# Patient Record
Sex: Female | Born: 1955 | Race: White | Hispanic: No | Smoking: Current every day smoker
Health system: Southern US, Community
[De-identification: ages and names within clinical notes are randomized; demographics above are authoritative.]

## PROBLEM LIST (undated history)

## (undated) DIAGNOSIS — K589 Irritable bowel syndrome without diarrhea: Secondary | ICD-10-CM

## (undated) DIAGNOSIS — R87619 Unspecified abnormal cytological findings in specimens from cervix uteri: Secondary | ICD-10-CM

## (undated) DIAGNOSIS — R1013 Epigastric pain: Secondary | ICD-10-CM

## (undated) DIAGNOSIS — R112 Nausea with vomiting, unspecified: Secondary | ICD-10-CM

## (undated) DIAGNOSIS — M1990 Unspecified osteoarthritis, unspecified site: Secondary | ICD-10-CM

## (undated) DIAGNOSIS — J302 Other seasonal allergic rhinitis: Secondary | ICD-10-CM

## (undated) DIAGNOSIS — M75102 Unspecified rotator cuff tear or rupture of left shoulder, not specified as traumatic: Secondary | ICD-10-CM

## (undated) DIAGNOSIS — Z8619 Personal history of other infectious and parasitic diseases: Secondary | ICD-10-CM

## (undated) DIAGNOSIS — B009 Herpesviral infection, unspecified: Secondary | ICD-10-CM

## (undated) DIAGNOSIS — Z9889 Other specified postprocedural states: Secondary | ICD-10-CM

## (undated) DIAGNOSIS — C439 Malignant melanoma of skin, unspecified: Secondary | ICD-10-CM

## (undated) DIAGNOSIS — J45909 Unspecified asthma, uncomplicated: Secondary | ICD-10-CM

## (undated) DIAGNOSIS — M25519 Pain in unspecified shoulder: Secondary | ICD-10-CM

## (undated) HISTORY — DX: Unspecified abnormal cytological findings in specimens from cervix uteri: R87.619

## (undated) HISTORY — PX: OTHER SURGICAL HISTORY: SHX169

## (undated) HISTORY — DX: Other seasonal allergic rhinitis: J30.2

## (undated) HISTORY — DX: Herpesviral infection, unspecified: B00.9

## (undated) HISTORY — PX: SKIN GRAFT: SHX250

## (undated) HISTORY — DX: Malignant melanoma of skin, unspecified: C43.9

## (undated) HISTORY — PX: MOLE REMOVAL: SHX2046

## (undated) HISTORY — DX: Personal history of other infectious and parasitic diseases: Z86.19

## (undated) HISTORY — DX: Irritable bowel syndrome, unspecified: K58.9

## (undated) HISTORY — DX: Unspecified osteoarthritis, unspecified site: M19.90

## (undated) HISTORY — PX: TUBAL LIGATION: SHX77

## (undated) HISTORY — DX: Unspecified rotator cuff tear or rupture of left shoulder, not specified as traumatic: M75.102

## (undated) HISTORY — DX: Epigastric pain: R10.13

## (undated) HISTORY — DX: Pain in unspecified shoulder: M25.519

## (undated) HISTORY — DX: Unspecified asthma, uncomplicated: J45.909

---

## 1995-05-25 HISTORY — PX: DILATION AND CURETTAGE OF UTERUS: SHX78

## 1997-09-05 ENCOUNTER — Other Ambulatory Visit: Admission: RE | Admit: 1997-09-05 | Discharge: 1997-09-05 | Payer: Self-pay | Admitting: Obstetrics and Gynecology

## 1998-04-04 ENCOUNTER — Ambulatory Visit (HOSPITAL_COMMUNITY): Admission: RE | Admit: 1998-04-04 | Discharge: 1998-04-04 | Payer: Self-pay | Admitting: Obstetrics and Gynecology

## 1998-04-04 ENCOUNTER — Encounter: Payer: Self-pay | Admitting: Obstetrics and Gynecology

## 1998-04-22 ENCOUNTER — Ambulatory Visit (HOSPITAL_COMMUNITY): Admission: RE | Admit: 1998-04-22 | Discharge: 1998-04-22 | Payer: Self-pay | Admitting: Obstetrics and Gynecology

## 1998-04-23 ENCOUNTER — Other Ambulatory Visit: Admission: RE | Admit: 1998-04-23 | Discharge: 1998-04-23 | Payer: Self-pay | Admitting: Obstetrics and Gynecology

## 1998-05-12 ENCOUNTER — Encounter: Payer: Self-pay | Admitting: Obstetrics and Gynecology

## 1998-05-12 ENCOUNTER — Ambulatory Visit (HOSPITAL_COMMUNITY): Admission: RE | Admit: 1998-05-12 | Discharge: 1998-05-12 | Payer: Self-pay | Admitting: Obstetrics and Gynecology

## 1998-05-21 ENCOUNTER — Ambulatory Visit (HOSPITAL_COMMUNITY): Admission: RE | Admit: 1998-05-21 | Discharge: 1998-05-21 | Payer: Self-pay | Admitting: Obstetrics and Gynecology

## 1998-05-29 ENCOUNTER — Other Ambulatory Visit: Admission: RE | Admit: 1998-05-29 | Discharge: 1998-05-29 | Payer: Self-pay | Admitting: Obstetrics and Gynecology

## 2000-09-06 ENCOUNTER — Other Ambulatory Visit: Admission: RE | Admit: 2000-09-06 | Discharge: 2000-09-06 | Payer: Self-pay | Admitting: Obstetrics and Gynecology

## 2000-09-07 ENCOUNTER — Ambulatory Visit (HOSPITAL_COMMUNITY): Admission: RE | Admit: 2000-09-07 | Discharge: 2000-09-07 | Payer: Self-pay | Admitting: *Deleted

## 2000-09-07 ENCOUNTER — Encounter: Payer: Self-pay | Admitting: *Deleted

## 2002-03-06 ENCOUNTER — Other Ambulatory Visit: Admission: RE | Admit: 2002-03-06 | Discharge: 2002-03-06 | Payer: Self-pay | Admitting: Obstetrics and Gynecology

## 2003-04-16 ENCOUNTER — Other Ambulatory Visit: Admission: RE | Admit: 2003-04-16 | Discharge: 2003-04-16 | Payer: Self-pay | Admitting: Obstetrics and Gynecology

## 2003-04-24 ENCOUNTER — Ambulatory Visit (HOSPITAL_COMMUNITY): Admission: RE | Admit: 2003-04-24 | Discharge: 2003-04-24 | Payer: Self-pay | Admitting: Obstetrics and Gynecology

## 2004-06-12 ENCOUNTER — Other Ambulatory Visit: Admission: RE | Admit: 2004-06-12 | Discharge: 2004-06-12 | Payer: Self-pay | Admitting: Obstetrics and Gynecology

## 2004-08-31 ENCOUNTER — Ambulatory Visit (HOSPITAL_COMMUNITY): Admission: RE | Admit: 2004-08-31 | Discharge: 2004-08-31 | Payer: Self-pay | Admitting: Obstetrics and Gynecology

## 2004-08-31 ENCOUNTER — Ambulatory Visit (HOSPITAL_BASED_OUTPATIENT_CLINIC_OR_DEPARTMENT_OTHER): Admission: RE | Admit: 2004-08-31 | Discharge: 2004-08-31 | Payer: Self-pay | Admitting: Obstetrics and Gynecology

## 2005-07-26 ENCOUNTER — Ambulatory Visit (HOSPITAL_COMMUNITY): Admission: RE | Admit: 2005-07-26 | Discharge: 2005-07-26 | Payer: Self-pay | Admitting: Obstetrics and Gynecology

## 2005-07-29 ENCOUNTER — Other Ambulatory Visit: Admission: RE | Admit: 2005-07-29 | Discharge: 2005-07-29 | Payer: Self-pay | Admitting: Obstetrics & Gynecology

## 2006-09-19 ENCOUNTER — Ambulatory Visit (HOSPITAL_COMMUNITY): Admission: RE | Admit: 2006-09-19 | Discharge: 2006-09-19 | Payer: Self-pay | Admitting: Obstetrics and Gynecology

## 2006-09-29 ENCOUNTER — Other Ambulatory Visit: Admission: RE | Admit: 2006-09-29 | Discharge: 2006-09-29 | Payer: Self-pay | Admitting: Obstetrics & Gynecology

## 2007-06-01 ENCOUNTER — Encounter: Payer: Self-pay | Admitting: Orthopedic Surgery

## 2007-06-01 ENCOUNTER — Ambulatory Visit (HOSPITAL_COMMUNITY): Admission: RE | Admit: 2007-06-01 | Discharge: 2007-06-01 | Payer: Self-pay | Admitting: Family Medicine

## 2007-06-07 ENCOUNTER — Ambulatory Visit: Payer: Self-pay | Admitting: Orthopedic Surgery

## 2007-06-07 DIAGNOSIS — M25519 Pain in unspecified shoulder: Secondary | ICD-10-CM | POA: Insufficient documentation

## 2007-06-07 DIAGNOSIS — M67919 Unspecified disorder of synovium and tendon, unspecified shoulder: Secondary | ICD-10-CM | POA: Insufficient documentation

## 2007-06-07 DIAGNOSIS — M719 Bursopathy, unspecified: Secondary | ICD-10-CM

## 2007-08-24 ENCOUNTER — Ambulatory Visit (HOSPITAL_COMMUNITY): Admission: RE | Admit: 2007-08-24 | Discharge: 2007-08-24 | Payer: Self-pay | Admitting: Family Medicine

## 2007-09-28 ENCOUNTER — Ambulatory Visit (HOSPITAL_COMMUNITY): Admission: RE | Admit: 2007-09-28 | Discharge: 2007-09-28 | Payer: Self-pay | Admitting: Obstetrics and Gynecology

## 2007-10-03 ENCOUNTER — Other Ambulatory Visit: Admission: RE | Admit: 2007-10-03 | Discharge: 2007-10-03 | Payer: Self-pay | Admitting: Obstetrics & Gynecology

## 2007-10-10 LAB — HM COLONOSCOPY: HM Colonoscopy: NEGATIVE

## 2007-10-31 ENCOUNTER — Ambulatory Visit (HOSPITAL_COMMUNITY): Admission: RE | Admit: 2007-10-31 | Discharge: 2007-10-31 | Payer: Self-pay | Admitting: General Surgery

## 2008-09-30 ENCOUNTER — Ambulatory Visit (HOSPITAL_COMMUNITY): Admission: RE | Admit: 2008-09-30 | Discharge: 2008-09-30 | Payer: Self-pay | Admitting: Obstetrics and Gynecology

## 2008-10-09 ENCOUNTER — Ambulatory Visit (HOSPITAL_COMMUNITY): Admission: RE | Admit: 2008-10-09 | Discharge: 2008-10-09 | Payer: Self-pay | Admitting: Obstetrics and Gynecology

## 2008-10-14 ENCOUNTER — Emergency Department (HOSPITAL_COMMUNITY): Admission: EM | Admit: 2008-10-14 | Discharge: 2008-10-14 | Payer: Self-pay | Admitting: Emergency Medicine

## 2009-02-19 ENCOUNTER — Ambulatory Visit (HOSPITAL_COMMUNITY): Admission: RE | Admit: 2009-02-19 | Discharge: 2009-02-19 | Payer: Self-pay | Admitting: Family Medicine

## 2009-07-02 ENCOUNTER — Ambulatory Visit (HOSPITAL_COMMUNITY): Admission: RE | Admit: 2009-07-02 | Discharge: 2009-07-02 | Payer: Self-pay | Admitting: Family Medicine

## 2009-07-10 ENCOUNTER — Ambulatory Visit: Payer: Self-pay | Admitting: Gastroenterology

## 2009-07-10 DIAGNOSIS — R1013 Epigastric pain: Secondary | ICD-10-CM | POA: Insufficient documentation

## 2009-07-10 DIAGNOSIS — C439 Malignant melanoma of skin, unspecified: Secondary | ICD-10-CM | POA: Insufficient documentation

## 2009-07-10 DIAGNOSIS — Z8719 Personal history of other diseases of the digestive system: Secondary | ICD-10-CM | POA: Insufficient documentation

## 2009-07-10 DIAGNOSIS — K5900 Constipation, unspecified: Secondary | ICD-10-CM | POA: Insufficient documentation

## 2009-07-10 DIAGNOSIS — Z8619 Personal history of other infectious and parasitic diseases: Secondary | ICD-10-CM | POA: Insufficient documentation

## 2009-07-16 ENCOUNTER — Encounter (HOSPITAL_COMMUNITY): Admission: RE | Admit: 2009-07-16 | Discharge: 2009-08-15 | Payer: Self-pay | Admitting: Gastroenterology

## 2009-07-18 ENCOUNTER — Encounter: Payer: Self-pay | Admitting: Gastroenterology

## 2009-07-28 ENCOUNTER — Ambulatory Visit: Payer: Self-pay | Admitting: Gastroenterology

## 2009-07-28 ENCOUNTER — Ambulatory Visit (HOSPITAL_COMMUNITY): Admission: RE | Admit: 2009-07-28 | Discharge: 2009-07-28 | Payer: Self-pay | Admitting: Gastroenterology

## 2009-10-29 ENCOUNTER — Ambulatory Visit: Payer: Self-pay | Admitting: Gastroenterology

## 2010-04-29 ENCOUNTER — Ambulatory Visit: Payer: Self-pay | Admitting: Gastroenterology

## 2010-06-14 ENCOUNTER — Encounter: Payer: Self-pay | Admitting: Family Medicine

## 2010-06-25 NOTE — Assessment & Plan Note (Signed)
Summary: EPIGASTRIC PAIN, IBS-C   Visit Type:  Follow-up Visit Primary Care Provider:  Dr. Regino Schultze  Chief Complaint:  F/U abd pain.  History of Present Illness: Read literature and concerned about OMP and it's side effects. Drinks soft drink and gets signs. Did go w/o it for 2 days and no signs. Probiotics are helping. BM: daily.   Current Medications (verified): 1)  Allegra 180 Mg Tabs (Fexofenadine Hcl) .... As Needed 2)  Rynatan 9-25 Mg Tabs (Chlorphen Tan-Phenyleph Tan) .... As Needed 3)  Omeprazole 20 Mg Cpdr (Omeprazole) .... Take 1 Tablet By Mouth Once A Day  Allergies (verified): No Known Drug Allergies  Past History:  Past Medical History: Last updated: 07/10/2009 seasonal allergies Hx of SALMONELLA INFECTION (ICD-003.9) MELANOMA OF SKIN, SITE UNSPECIFIED (ICD-172.9) right thigh 2009 IRRITABLE BOWEL SYNDROME, HX OF (ICD-V12.79) CONSTIPATION (ICD-564.00) ABDOMINAL PAIN, EPIGASTRIC (ICD-789.06) SHOULDER PAIN (ICD-719.41) ROTATOR CUFF SYNDROME, LEFT (ICD-726.10)  Past Surgical History: Last updated: 07/10/2009 tubal ligation thermal ablation of uterus  Vital Signs:  Patient profile:   55 year old female Height:      65.5 inches Weight:      140.50 pounds BMI:     23.11 Temp:     98.1 degrees F oral Pulse rate:   64 / minute BP sitting:   120 / 78  (left arm)  Physical Exam  General:  Well developed, well nourished, no acute distress. Head:  Normocephalic and atraumatic. Eyes:  PERRLA, no icterus. Mouth:  No deformity or lesions, dentition normal. Neck:  Supple; no masses. Lungs:  Clear throughout to auscultation. Heart:  Regular rate and rhythm; no murmurs. Abdomen:  Soft, nontender and nondistended. Normal bowel sounds.  Extremities:  No edema or deformities noted. Neurologic:  Alert and  oriented x4;  grossly normal neurologically.  Impression & Recommendations:  Problem # 1:  ABDOMINAL PAIN, EPIGASTRIC (ICD-789.06) Assessment Improved  Likely  2o to IBS-c and gastritis. Sx improved after OMP and probiotics. May use OMP as needed. Continue probiotics. OPV in 6-12 mos. Call if Sx return and will see sooner.   CC: PCP  Orders: Est. Patient Level II ([address])  Appended Document: EPIGASTRIC PAIN, IBS-C reminder in computer  Appended Document: FU OV IN 6 MONTHS,EPIGASTRIC PAIN/SS    Prescriptions: OMEPRAZOLE 20 MG CPDR (OMEPRAZOLE) Take 1 tablet by mouth once a day  as needed  #30 x 11   Entered and Authorized by:   Gerrit Halls NP   Signed by:   Gerrit Halls NP on 04/29/2010   Method used:   Faxed to ...       [facility] (retail)       [address]       [place], [place]  [address]       Ph: [phone]       Fax: [phone]   RxID:   [id number]

## 2010-06-25 NOTE — Assessment & Plan Note (Signed)
Summary: FU OV IN 6 MONTHS,EPIGASTRIC PAIN/SS   Visit Type:  Follow-up Visit Primary Care Provider:  Dr. Regino Schultze  CC:  F/U epigastric pain.  History of Present Illness: Ms. [name] returns for a 6 month follow-up for gastritis, IBS-C. Reports occasionally indigestion, but takes prilosec once or twice/week, or if going out to eat. symptoms well-controlled, no dysphagia. drinking 64 oz of water daily, averages BM every 2 days, takes generic laxative, then has BM. does laxative twice/month. hard stools. doesn't want to take colace, getting ready to take probiotics again.   Current Medications (verified): 1)  Allegra 180 Mg Tabs (Fexofenadine Hcl) .... As Needed 2)  Rynatan 9-25 Mg Tabs (Chlorphen Tan-Phenyleph Tan) .... As Needed 3)  Omeprazole 20 Mg Cpdr (Omeprazole) .... Take 1 Tablet By Mouth Once A Day  As Needed 4)  Vitamin B-12 2500 Mcg Subl (Cyanocobalamin) .Marland Kitchen.. 1 Sl Daily  Allergies (verified): No Known Drug Allergies  Past History:  Past Medical History: Last updated: 07/10/2009 seasonal allergies Hx of SALMONELLA INFECTION (ICD-003.9) MELANOMA OF SKIN, SITE UNSPECIFIED (ICD-172.9) right thigh 2009 IRRITABLE BOWEL SYNDROME, HX OF (ICD-V12.79) CONSTIPATION (ICD-564.00) ABDOMINAL PAIN, EPIGASTRIC (ICD-789.06) SHOULDER PAIN (ICD-719.41) ROTATOR CUFF SYNDROME, LEFT (ICD-726.10)  Review of Systems General:  Denies fever, chills, and anorexia. Eyes:  Denies blurring, irritation, and discharge. ENT:  Denies sore throat, hoarseness, and difficulty swallowing. CV:  Denies chest pains and syncope. Resp:  Denies dyspnea at rest and wheezing. GI:  Complains of constipation; denies difficulty swallowing, pain on swallowing, nausea, abdominal pain, change in bowel habits, bloody BM's, and black BMs; rare indigestion, relieved by as needed prilosec. GU:  Denies urinary burning and urinary frequency. MS:  Denies joint pain / LOM and joint swelling. Derm:  Denies rash, itching, and dry  skin. Neuro:  Denies weakness and syncope. Psych:  Denies depression and anxiety. Endo:  Denies cold intolerance and heat intolerance.  Vital Signs:  Patient profile:   55 year old female Height:      65.5 inches Weight:      145 pounds BMI:     23.85 Temp:     98.2 degrees F oral Pulse rate:   72 / minute BP sitting:   120 / 70  (left arm) Cuff size:   regular  Vitals Entered By: Cloria Spring LPN (April 29, 2010 2:20 PM)  Physical Exam  General:  Well developed, well nourished, no acute distress. Lungs:  Clear throughout to auscultation. Heart:  Regular rate and rhythm; no murmurs, rubs,  or bruits. Abdomen:  normal bowel sounds, without guarding, without rebound, no distesion, no tenderness, no masses, and no hepatomegally or splenomegaly.   Msk:  Symmetrical with no gross deformities. Normal posture. Neurologic:  Alert and  oriented x4;  grossly normal neurologically.  Impression & Recommendations:  Problem # 1:  ABDOMINAL PAIN, EPIGASTRIC (ICD-31.41)  55 year old pleasant female with resolved epigastric pain. rare reflux, takes prilosec only as needed. Doing well.   Continue Prilosec as needed F/U in 1 year or sooner if indicated  Orders: Est. Patient Level II ([address])  Problem # 2:  CONSTIPATION (ICD-564.00)  Hx of IBS-C. BM every two days, takes OTC laxative as needed. Does not want to take stool softeners.   Probiotic Supplemental fiber of choice F/U in 1 year  Orders: Est. Patient Level II ([address])  Appended Document: FU OV IN 6 MONTHS,EPIGASTRIC PAIN/SS    Prescriptions: OMEPRAZOLE 20 MG CPDR (OMEPRAZOLE) Take 1 tablet by mouth once a day  as  needed  #30 x 11   Entered and Authorized by:   Gerrit Halls NP   Signed by:   Gerrit Halls NP on 04/29/2010   Method used:   Faxed to ...       [facility] (retail)       [address]       [place], [place]  [address]       Ph: [phone]       Fax: [phone]   RxID:    [id number]

## 2010-06-25 NOTE — Letter (Signed)
Summary: EGD/TCS ORDER  EGD/TCS ORDER   Imported By: [facility] 07/18/2009 12:31:06  _____________________________________________________________________  External Attachment:    Type:   Image     Comment:   External Document

## 2010-06-25 NOTE — Letter (Signed)
Summary: HIDA SCAN ORDER  HIDA SCAN ORDER   Imported By: [facility] 07/10/2009 11:35:42  _____________________________________________________________________  External Attachment:    Type:   Image     Comment:   External Document

## 2010-06-25 NOTE — Assessment & Plan Note (Signed)
Summary: ABD PAIN,EPIGASTRIC PAIN,VOMITING,NAUSEA.GLU   Visit Type:  Initial Consult Referring Provider:  C[facility] Primary Care Provider:  C[facility]  Chief Complaint:  abd pain/ epigastric pain.  History of Present Illness: 55 y/o caucasian female w/ abd pain  & N/V.  1 month ago began to have radiating intermittant epigastric pain w/ vomiting.  2 discrete episodes that lasted almost all day long.  Both episodes right after lunch.  c/o daily indigestion and belching, does not take anything.  Pepsi helps.  Denies dysphagia, odynphagia, anorexia, or early satiety.  Hx salmonella last fall.  Weight gain 14# in 4 months since then.  c/o constipation up to 3 days between BM, taking OTC correctol once weekly.  Denies enema use.  Labs done at Cresenzo's office recently, but I do not have yet.  Abd US-> 07/02/09 normal except small cyst left kidney.  HX IBS years ago, but has not had problems recently. Taking IBU once/mo.    Current Problems (verified): 1)  Hx of Salmonella Infection  (ICD-003.9) 2)  Melanoma of Skin, Site Unspecified  (ICD-172.9) 3)  Irritable Bowel Syndrome, Hx of  (ICD-V12.79) 4)  Constipation  (ICD-564.00) 5)  Abdominal Pain, Epigastric  (ICD-789.06) 6)  Shoulder Pain  (ICD-719.41) 7)  Rotator Cuff Syndrome, Left  (ICD-726.10) 8)  Family History of Arthritis  (ICD-V17.7) 9)  Family History of Diabetes  (ICD-V18.0)  Current Medications (verified): 1)  Allegra 180 Mg Tabs (Fexofenadine Hcl) .... As Needed 2)  Rynatan 9-25 Mg Tabs (Chlorphen Tan-Phenyleph Tan) .... As Needed  Allergies (verified): No Known Drug Allergies  Past History:  Past Medical History: seasonal allergies Hx of SALMONELLA INFECTION (ICD-003.9) MELANOMA OF SKIN, SITE UNSPECIFIED (ICD-172.9) right thigh 2009 IRRITABLE BOWEL SYNDROME, HX OF (ICD-V12.79) CONSTIPATION (ICD-564.00) ABDOMINAL PAIN, EPIGASTRIC (ICD-789.06) SHOULDER PAIN (ICD-719.41) ROTATOR CUFF SYNDROME, LEFT (ICD-726.10)  Past  Surgical History: tubal ligation thermal ablation of uterus  Family History: No known family history of colorectal carcinoma, IBD, liver or chronic GI problems. 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 Quit 2007, 28 pkyr hx Alcohol Use - yes, 3 beers/2-3 glasses of wine 4 nights/wk Illicit Drug Use - no Patient does not get regular exercise.  Smoking Status:  quit Does Patient Exercise:  no Drug Use:  no  Review of Systems General:  Complains of fatigue, malaise, and sleep disorder; denies fever, sweats, anorexia, and weakness. CV:  Denies chest pains, angina, palpitations, syncope, dyspnea on exertion, orthopnea, PND, peripheral edema, and claudication. Resp:  Denies dyspnea at rest, dyspnea with exercise, cough, sputum, wheezing, coughing up blood, and pleurisy. GI:  Denies jaundice, diarrhea, bloody BM's, black BMs, and fecal incontinence. Derm:  Complains of moles; denies rash, itching, dry skin, hives, warts, and unhealing ulcers; Dr. Charlton Haws. Psych:  Denies depression, anxiety, memory loss, suicidal ideation, hallucinations, paranoia, phobia, and confusion. Heme:  Denies bruising, bleeding, and enlarged lymph nodes.  Vital Signs:  Patient profile:   55 year old female Height:      65.5 inches Weight:      144 pounds BMI:     23.68 Temp:     98.8 degrees F oral Pulse rate:   64 / minute BP sitting:   118 / 78  (left arm) Cuff size:   regular  Vitals Entered By: Cloria Spring LPN (July 10, 2009 10:15 AM)  Physical Exam  General:  Well developed, well nourished, no acute distress. Head:  Normocephalic and atraumatic. Eyes:  Sclera clear, no icterus. Ears:  Normal auditory acuity. Nose:  No deformity, discharge,  or lesions. Mouth:  No deformity or lesions, dentition normal. Neck:  Supple; no masses  or thyromegaly. Lungs:  Clear throughout to auscultation. Heart:  Regular rate and rhythm; no murmurs, rubs,  or bruits. Abdomen:  Soft, nontender and nondistended. No masses, hepatosplenomegaly or hernias noted. Normal bowel sounds.without guarding and without rebound.  Negative Murphy's sign. Msk:  Symmetrical with no gross deformities. Normal posture. Pulses:  Normal pulses noted. Extremities:  No clubbing, cyanosis, edema or deformities noted. Neurologic:  Alert and  oriented x4;  grossly normal neurologically. Skin:  Intact without significant lesions or rashes. Cervical Nodes:  No significant cervical adenopathy. Psych:  Alert and cooperative. Normal mood and affect.  Impression & Recommendations:  Problem # 1:  ABDOMINAL PAIN, EPIGASTRIC (ICD-789.06) 55 y/o caucasian female w/ intermittant post-prandial biliary type pain, will check for biliary dyskinesia.  Other differentials include GERD, gastritis, PUD, IBS-C.  Orders: Consultation Level III ([address])  Problem # 2:  CONSTIPATION (ICD-564.00) See #1  Patient Instructions: 1)  I will call w/ HIDA scan results. 2)  If normal, we will set up EGD and screening colonoscopy w/ Dr Darrick Penna 3)  Constipation brochure given.  4)  Begin Miralax 17 grams daily as needed for constipation (samples given)

## 2010-10-06 NOTE — H&P (Signed)
[name], [name]                  ACCOUNT NO.:  [id number]   MEDICAL RECORD NO.:  [id number]           PATIENT TYPE:  AMB   LOCATION:  DAY                           FACILITY:  [facility]   PHYSICIAN:  Dalia Heading, M.D.  DATE OF BIRTH:  February 22, 1956   DATE OF ADMISSION:  DATE OF DISCHARGE:  LH                              HISTORY & PHYSICAL   CHIEF COMPLAINT:  Need for screening colonoscopy.   HISTORY OF PRESENT ILLNESS:  The patient is a 55 year old white female  who was referred for endoscopic evaluation.  She needs a colonoscopy for  screening purposes.  No abdominal pain, weight loss, nausea, vomiting,  diarrhea, constipation, melena, or hematochezia have been noted.  She  has never had a colonoscopy.  There is no family history of colon  carcinoma.   PAST MEDICAL HISTORY:  Includes extrinsic allergies.   PAST SURGICAL HISTORY:  Tubal ligation.   CURRENT MEDICATIONS:  Clarinex.   ALLERGIES:  No known drug allergies.   REVIEW OF SYSTEMS:  The patient drinks 12 packs of beer a week.  She  denies any alcohol use.  She denies any other cardiopulmonary  difficulties or bleeding disorders.   PHYSICAL EXAMINATION:  GENERAL:  The patient is a well-developed and  well-nourished white female in no acute distress.  LUNGS:  Clear to auscultation with equal breath sounds bilaterally.  HEART:  Reveals regular rate and rhythm without S3, S4, or murmurs.  ABDOMEN:  Soft, nontender, and nondistended.  No hepatosplenomegaly or  masses are noted.  RECTAL:  Deferred to the procedure.   IMPRESSION:  Need for screening colonoscopy.   PLAN:  The patient is scheduled for a colonoscopy on Oct 10, 2007.  The  risks and benefits of the procedure including bleeding and perforation  were fully explained to the patient, gave informed consent.      Dalia Heading, M.D.  Electronically Signed     MAJ/MEDQ  D:  09/28/2007  T:  09/29/2007  Job:  [id number]   cc:   Kirk Ruths, M.D.  Fax:  [phone]

## 2010-10-09 NOTE — Op Note (Signed)
[name], [name]                  ACCOUNT NO.:  [id number]   MEDICAL RECORD NO.:  [id number]          PATIENT TYPE:  AMB   LOCATION:  [facility]                         FACILITY:  [facility]   PHYSICIAN:  Cynthia P. Romine, M.D.DATE OF BIRTH:  [birth year]   DATE OF PROCEDURE:  08/31/2004  DATE OF DISCHARGE:                                 OPERATIVE REPORT   PREOPERATIVE DIAGNOSIS:  Menorrhagia.   POSTOPERATIVE DIAGNOSIS:  Menorrhagia.   PROCEDURE:  Endometrial ablation using hydrotherm ablation.   SURGEON:  Cynthia P. Romine, M.D.   ANESTHESIA:  General by LMA.   ESTIMATED BLOOD LOSS:  Minimal.   COMPLICATIONS:  None.   DESCRIPTION OF PROCEDURE:  The patient was taken to the operating room, and  after the induction of adequate general anesthesia was placed in the dorsal  lithotomy position and prepped and draped in the usual fashion.  The bladder  was drained with a red rubber catheter.  A posterior weighted and anterior  Sims retractor were placed.  The cervix was grasped at the anterior lip with  a single-tooth tenaculum.  The uterus was sounded to 7 cm.  The cervix was  dilated to a #25 Shawnie Pons.  The hysteroscope was introduced.  Photographic  documentation was taken of the endometrial cavity including the tubal ostia.  The scope was withdrawn.  Just inside the internal os an endometrial  ablation was carried out in the usual fashion.  There were no complications.  Upon the completion of the procedure, again photographic documentation was  taken of the cavity after ablation.  The instruments were then removed from  the uterus and vagina and the procedure was terminated.   The patient tolerated it well and went in satisfactory condition to the post-  anesthesia recovery.      CPR/MEDQ  D:  08/31/2004  T:  08/31/2004  Job:  [id number]

## 2010-10-23 ENCOUNTER — Other Ambulatory Visit: Payer: Self-pay | Admitting: Certified Nurse Midwife

## 2010-10-23 DIAGNOSIS — Z139 Encounter for screening, unspecified: Secondary | ICD-10-CM

## 2010-10-26 ENCOUNTER — Ambulatory Visit (HOSPITAL_COMMUNITY)
Admission: RE | Admit: 2010-10-26 | Discharge: 2010-10-26 | Disposition: A | Discharge status: Home / Self Care | Payer: 59 | Source: Ambulatory Visit | Attending: Certified Nurse Midwife | Admitting: Certified Nurse Midwife

## 2010-10-26 DIAGNOSIS — Z1231 Encounter for screening mammogram for malignant neoplasm of breast: Secondary | ICD-10-CM | POA: Insufficient documentation

## 2010-10-26 DIAGNOSIS — Z139 Encounter for screening, unspecified: Secondary | ICD-10-CM

## 2011-10-12 ENCOUNTER — Other Ambulatory Visit: Payer: Self-pay | Admitting: Certified Nurse Midwife

## 2011-10-12 DIAGNOSIS — Z139 Encounter for screening, unspecified: Secondary | ICD-10-CM

## 2011-10-28 ENCOUNTER — Ambulatory Visit (HOSPITAL_COMMUNITY)
Admission: RE | Admit: 2011-10-28 | Discharge: 2011-10-28 | Disposition: A | Discharge status: Home / Self Care | Payer: 59 | Source: Ambulatory Visit | Attending: Certified Nurse Midwife | Admitting: Certified Nurse Midwife

## 2011-10-28 DIAGNOSIS — Z139 Encounter for screening, unspecified: Secondary | ICD-10-CM

## 2011-10-28 DIAGNOSIS — Z1231 Encounter for screening mammogram for malignant neoplasm of breast: Secondary | ICD-10-CM | POA: Insufficient documentation

## 2011-10-28 LAB — HM MAMMOGRAPHY: HM Mammogram: NORMAL

## 2012-10-17 ENCOUNTER — Other Ambulatory Visit: Payer: Self-pay | Admitting: Certified Nurse Midwife

## 2012-10-17 DIAGNOSIS — Z139 Encounter for screening, unspecified: Secondary | ICD-10-CM

## 2012-10-23 ENCOUNTER — Encounter: Payer: Self-pay | Admitting: Certified Nurse Midwife

## 2012-10-24 ENCOUNTER — Encounter: Payer: Self-pay | Admitting: Certified Nurse Midwife

## 2012-10-24 ENCOUNTER — Ambulatory Visit (INDEPENDENT_AMBULATORY_CARE_PROVIDER_SITE_OTHER): Payer: 59 | Admitting: Certified Nurse Midwife

## 2012-10-24 VITALS — BP 100/64 | Ht 65.5 in | Wt 155.0 lb

## 2012-10-24 DIAGNOSIS — Z0000 Encounter for general adult medical examination without abnormal findings: Secondary | ICD-10-CM

## 2012-10-24 DIAGNOSIS — N952 Postmenopausal atrophic vaginitis: Secondary | ICD-10-CM

## 2012-10-24 DIAGNOSIS — Z01419 Encounter for gynecological examination (general) (routine) without abnormal findings: Secondary | ICD-10-CM

## 2012-10-24 LAB — POCT URINALYSIS DIPSTICK
Bilirubin, UA: NEGATIVE
Glucose, UA: NEGATIVE
Leukocytes, UA: NEGATIVE
Urobilinogen, UA: NEGATIVE

## 2012-10-24 MED ORDER — ESTRADIOL 10 MCG VA TABS: 1.0000 | ORAL_TABLET | VAGINAL | Status: DC

## 2012-10-24 NOTE — Patient Instructions (Addendum)

## 2012-10-24 NOTE — Progress Notes (Signed)
57 y.o. G0P0000 Married Caucasian Fe here for annual exam. Menopausal no HRT, denies vaginal bleeding or dryness.  Uses Vagifem for vaginal dryness. Mammogram scheduled for June 2014 . Still has social stress with spouse's cancer, which has spread.  Has friend and family for social support.  Sees PCP for aex , and labs. No health issues today   No LMP recorded. Patient is postmenopausal.          Sexually active: yes  The current method of family planning is tubal ligation.    Exercising: yes  walking & golf Smoker:  no  Health Maintenance: Pap:  09-3011 neg HPV HR neg MMG:  10-28-11 Colonoscopy:  2011 BMD:   2012 TDaP:  2010 Labs: Poct urine-neg Self breast exam: monthly   reports that she drinks about 6.0 ounces of alcohol per week. She reports that she does not use illicit drugs.  Past Medical History  Diagnosis Date  . Seasonal allergies   . History of Salmonella infection   . Melanoma of skin, site unspecified   . Irritable bowel syndrome   . Constipation   . Abdominal pain, epigastric   . Shoulder pain   . Rotator cuff syndrome of left shoulder   . HSV infection   . Asthma     as a child    Past Surgical History  Procedure Laterality Date  . Tubal ligation    . Thermal ablation      of the uterus  . Dilation and curettage of uterus  1997    bleeding    Current Outpatient Prescriptions  Medication Sig Dispense Refill  . chlorpheniramine-phenylephrine (RYNATAN) 9-25 MG TABS Take 1 tablet by mouth every 12 (twelve) hours as needed.        . Cholecalciferol (VITAMIN D PO) Take by mouth. With magnesium occasionally      . Cyanocobalamin (VITAMIN B-12) 2500 MCG SUBL Place under the tongue.        . fexofenadine (ALLEGRA) 180 MG tablet Take 180 mg by mouth daily.        . Multiple Vitamins-Minerals (MULTIVITAMIN PO) Take by mouth daily.      Marland Kitchen omeprazole (PRILOSEC) 20 MG capsule Take 20 mg by mouth daily.        . valACYclovir (VALTREX) 500 MG tablet Take 500 mg by  mouth 2 (two) times daily. X 3 days at onset       No current facility-administered medications for this visit.    Family History  Problem Relation Age of Onset  . Hypertension Mother   . Hypertension Father   . Diabetes Maternal Grandmother   . Heart attack Mother   . Kidney Stones Father   . Fibroids Mother     h/x hysterectomy  . Hypothyroidism Mother   . Depression Maternal Grandfather   . Leukemia Brother     ROS:  Pertinent items are noted in HPI.  Otherwise, a comprehensive ROS was negative.  Exam:   There were no vitals taken for this visit.    Ht Readings from Last 3 Encounters:  04/29/10 5' 5.5" (1.664 m)  10/29/09 5' 5.5" (1.664 m)  07/10/09 5' 5.5" (1.664 m)    General appearance: alert, cooperative and appears stated age Head: Normocephalic, without obvious abnormality, atraumatic Neck: no adenopathy, supple, symmetrical, trachea midline and thyroid normal to inspection and palpation Lungs: clear to auscultation bilaterally Breasts: normal appearance, no masses or tenderness, No nipple retraction or dimpling, No nipple discharge or bleeding, No axillary  or supraclavicular adenopathy Heart: regular rate and rhythm Abdomen: soft, non-tender; no masses,  no organomegaly Extremities: extremities normal, atraumatic, no cyanosis or edema Skin: Skin color, texture, turgor normal. No rashes or lesions Lymph nodes: Cervical, supraclavicular, and axillary nodes normal. No abnormal inguinal nodes palpated Neurologic: Grossly normal   Pelvic: External genitalia:  no lesions              Urethra:  normal appearing urethra with no masses, tenderness or lesions              Bartholin's and Skene's: normal                 Vagina: normal appearing vagina with normal color and discharge, no lesions              Cervix: normal, non tender              Pap taken: no Bimanual Exam:  Uterus:  normal size, contour, position, consistency, mobility, non-tender and anteflexed               Adnexa: normal adnexa and no mass, fullness, tenderness               Rectovaginal: Confirms               Anus:  normal sphincter tone, no lesions  A:  Well Woman with normal exam  Menopausal no HRT  Atrophic vaginitis uses vagifem  Social stress with spouse terminal cancer  P:  Reviewed health and wellness pertinent to exam  Aware of importance of evaluation if vaginal bleeding  Continue as directed, Rx Vagifem see order  Encouraged time for self too with spouse's health issues.   Pap smear as per guidelines   Mammogram yearly pap smear not taken today  counseled on breast self exam, menopause, adequate intake of calcium and vitamin D, diet and exercise  return annually or prn  An After Visit Summary was printed and given to the patient.  Reviewed, TL

## 2012-10-30 ENCOUNTER — Ambulatory Visit (HOSPITAL_COMMUNITY)
Admission: RE | Admit: 2012-10-30 | Discharge: 2012-10-30 | Disposition: A | Discharge status: Home / Self Care | Payer: 59 | Source: Ambulatory Visit | Attending: Certified Nurse Midwife | Admitting: Certified Nurse Midwife

## 2012-10-30 DIAGNOSIS — Z139 Encounter for screening, unspecified: Secondary | ICD-10-CM

## 2012-10-30 DIAGNOSIS — Z1231 Encounter for screening mammogram for malignant neoplasm of breast: Secondary | ICD-10-CM | POA: Insufficient documentation

## 2013-05-09 ENCOUNTER — Ambulatory Visit (HOSPITAL_COMMUNITY)
Admission: RE | Admit: 2013-05-09 | Discharge: 2013-05-09 | Disposition: A | Discharge status: Home / Self Care | Payer: 59 | Source: Ambulatory Visit | Attending: Family Medicine | Admitting: Family Medicine

## 2013-05-09 ENCOUNTER — Other Ambulatory Visit (HOSPITAL_COMMUNITY): Payer: Self-pay | Admitting: Family Medicine

## 2013-05-09 DIAGNOSIS — F17200 Nicotine dependence, unspecified, uncomplicated: Secondary | ICD-10-CM | POA: Insufficient documentation

## 2013-05-09 DIAGNOSIS — R05 Cough: Secondary | ICD-10-CM

## 2013-05-09 DIAGNOSIS — R059 Cough, unspecified: Secondary | ICD-10-CM

## 2013-05-09 DIAGNOSIS — Z0000 Encounter for general adult medical examination without abnormal findings: Secondary | ICD-10-CM

## 2013-09-24 ENCOUNTER — Other Ambulatory Visit: Payer: Self-pay | Admitting: Pediatrics

## 2013-09-24 DIAGNOSIS — Z139 Encounter for screening, unspecified: Secondary | ICD-10-CM

## 2013-10-25 ENCOUNTER — Encounter: Payer: Self-pay | Admitting: Certified Nurse Midwife

## 2013-10-25 ENCOUNTER — Ambulatory Visit (INDEPENDENT_AMBULATORY_CARE_PROVIDER_SITE_OTHER): Payer: 59 | Admitting: Certified Nurse Midwife

## 2013-10-25 VITALS — BP 100/60 | HR 60 | Resp 16 | Ht 65.5 in | Wt 153.0 lb

## 2013-10-25 DIAGNOSIS — N952 Postmenopausal atrophic vaginitis: Secondary | ICD-10-CM

## 2013-10-25 DIAGNOSIS — Z0000 Encounter for general adult medical examination without abnormal findings: Secondary | ICD-10-CM

## 2013-10-25 DIAGNOSIS — Z1211 Encounter for screening for malignant neoplasm of colon: Secondary | ICD-10-CM

## 2013-10-25 DIAGNOSIS — Z01419 Encounter for gynecological examination (general) (routine) without abnormal findings: Secondary | ICD-10-CM

## 2013-10-25 DIAGNOSIS — Z124 Encounter for screening for malignant neoplasm of cervix: Secondary | ICD-10-CM

## 2013-10-25 LAB — POCT URINALYSIS DIPSTICK
BILIRUBIN UA: NEGATIVE
GLUCOSE UA: NEGATIVE
Ketones, UA: NEGATIVE
Leukocytes, UA: NEGATIVE
NITRITE UA: NEGATIVE
Protein, UA: NEGATIVE
RBC UA: NEGATIVE
Urobilinogen, UA: NEGATIVE
pH, UA: 8

## 2013-10-25 MED ORDER — ESTRADIOL 10 MCG VA TABS: 1.0000 | ORAL_TABLET | VAGINAL | Status: DC

## 2013-10-25 NOTE — Progress Notes (Signed)
58 y.o. G0P0000 Married Caucasian Fe here for annual exam. Menopausal no HRT. Denies vaginal bleeding. Has not used Vagifem in ? Year. Spouse passed in 10/14 from tumor on spine. Emotionally "taking one day at the time". Attending grief counseling with Hospice. Putting house on the market now, to down size. Sisters providing support now and helping her get out more. Sees PCP prn. No health issues today other than sadness. Planning beach trip with extended family.  Patient's last menstrual period was 05/24/2005.          Sexually active: yes  The current method of family planning is tubal ligation.    Exercising: yes  Walking daily, sports Smoker:  no  Health Maintenance: Pap:  09/2011 Neg. HR HPV Neg MMG:  10/2012 BIRADS1. Scheduled 11/05/13 Colonoscopy:  07/2009 due 2016 BMD:   2010 TDaP:  2010 Labs: PCP UA: Clear   reports that she has quit smoking. She has never used smokeless tobacco. She reports that she drinks about 7.2 ounces of alcohol per week. She reports that she does not use illicit drugs.  Past Medical History  Diagnosis Date  . Seasonal allergies   . History of Salmonella infection   . Melanoma of skin, site unspecified   . Irritable bowel syndrome   . Constipation   . Abdominal pain, epigastric   . Shoulder pain   . Rotator cuff syndrome of left shoulder   . HSV infection   . Asthma     as a child    Past Surgical History  Procedure Laterality Date  . Tubal ligation    . Thermal ablation      of the uterus  . Dilation and curettage of uterus  1997    bleeding  . Mole removal      cancer  . Skin graft      Current Outpatient Prescriptions  Medication Sig Dispense Refill  . Cyanocobalamin (VITAMIN B-12) 2500 MCG SUBL Place under the tongue daily.       . Cholecalciferol (VITAMIN D PO) Take by mouth. With magnesium occasionally      . Estradiol (VAGIFEM) 10 MCG TABS vaginal tablet Place 1 tablet (10 mcg total) vaginally 2 (two) times a week. Use 2 times weekly  per vagina  8 tablet  12  . Multiple Vitamins-Minerals (MULTIVITAMIN PO) Take by mouth daily.      Marland Kitchen omeprazole (PRILOSEC) 20 MG capsule Take 20 mg by mouth as needed.      . Probiotic Product (PROBIOTIC PO) Take by mouth as needed.       No current facility-administered medications for this visit.    Family History  Problem Relation Age of Onset  . Hypertension Mother   . Heart attack Mother   . Fibroids Mother     h/x hysterectomy  . Hypothyroidism Mother   . Diabetes Mother   . Acromegaly Mother   . Hypertension Father   . Kidney Stones Father   . Diabetes Father   . Heart block Father     stint put in  . Diabetes Maternal Grandmother   . Heart failure Maternal Grandmother   . Depression Maternal Grandfather   . Parkinson's disease Maternal Grandfather   . Cancer Maternal Grandfather     brain  . Leukemia Brother   . Breast cancer Paternal Aunt   . Breast cancer Paternal Aunt     ROS:  Pertinent items are noted in HPI.  Otherwise, a comprehensive ROS was negative.  Exam:  BP 100/60  Pulse 60  Resp 16  Ht 5' 5.5" (1.664 m)  Wt 153 lb (69.4 kg)  BMI 25.06 kg/m2  LMP 05/24/2005 Height: 5' 5.5" (166.4 cm)  Ht Readings from Last 3 Encounters:  10/25/13 5' 5.5" (1.664 m)  10/24/12 5' 5.5" (1.664 m)  04/29/10 5' 5.5" (1.664 m)    General appearance: alert, cooperative and appears stated age Head: Normocephalic, without obvious abnormality, atraumatic Neck: no adenopathy, supple, symmetrical, trachea midline and thyroid normal to inspection and palpation Lungs: clear to auscultation bilaterally Breasts: normal appearance, no masses or tenderness, No nipple retraction or dimpling, No nipple discharge or bleeding, No axillary or supraclavicular adenopathy Heart: regular rate and rhythm Abdomen: soft, non-tender; no masses,  no organomegaly Extremities: extremities normal, atraumatic, no cyanosis or edema Skin: Skin color, texture, turgor normal. No rashes or  lesions Lymph nodes: Cervical, supraclavicular, and axillary nodes normal. No abnormal inguinal nodes palpated Neurologic: Grossly normal   Pelvic: External genitalia:  no lesions              Urethra:  normal appearing urethra with no masses, tenderness or lesions              Bartholin's and Skene's: normal                 Vagina: atrophic appearing vagina with normal color and discharge, no lesions, slightly tender with exam              Cervix: normal, nontender bleeding with pap only              Pap taken: yes Bimanual Exam:  Uterus:  normal size, contour, position, consistency, mobility, non-tender and anteverted              Adnexa: normal adnexa and no mass, fullness, tenderness               Rectovaginal: Confirms               Anus:  normal sphincter tone, no lesions  A:  Well Woman with normal exam  Menopausal no HRT  Atrophic vaginitis  Social stress with spouse death  P:   Reviewed health and wellness pertinent to exam  Aware of need to advise if vaginal bleeding.  Discussed findings and recommend restarting Vagifem again to prevent discomfort and increase risk of vaginal infections or UTI's. Patient agreeable  Rx Vagifem see order  Pap smear taken today with HPV reflex  IFOB dispensed, does not plan on repeat colonoscopy, aware of benefits  Encouraged to continue counseling and taking time with friends and family. Expressed my sympathy for her loss.  Counseled on breast self exam, adequate intake of calcium and vitamin D, diet and exercise  return annually or prn  An After Visit Summary was printed and given to the patient.

## 2013-10-25 NOTE — Patient Instructions (Signed)

## 2013-10-26 LAB — IPS PAP TEST WITH REFLEX TO HPV

## 2013-11-03 NOTE — Progress Notes (Signed)
Reviewed personally.  M. Suzanne Silvie Obremski, MD.  

## 2013-11-05 ENCOUNTER — Ambulatory Visit (HOSPITAL_COMMUNITY): Payer: 59

## 2013-11-05 ENCOUNTER — Ambulatory Visit (HOSPITAL_COMMUNITY)
Admission: RE | Admit: 2013-11-05 | Discharge: 2013-11-05 | Disposition: A | Discharge status: Home / Self Care | Payer: 59 | Source: Ambulatory Visit | Attending: Pediatrics | Admitting: Pediatrics

## 2013-11-05 DIAGNOSIS — Z139 Encounter for screening, unspecified: Secondary | ICD-10-CM

## 2013-11-05 DIAGNOSIS — Z1231 Encounter for screening mammogram for malignant neoplasm of breast: Secondary | ICD-10-CM | POA: Insufficient documentation

## 2014-03-12 ENCOUNTER — Telehealth: Payer: Self-pay | Admitting: Certified Nurse Midwife

## 2014-03-12 NOTE — Telephone Encounter (Signed)
Ok to refill 

## 2014-03-12 NOTE — Telephone Encounter (Signed)
Spoke with patient. Patient states that she needs refills on her Valtrex prescription. "When I saw her I probably told her that I did not need one because I thought I did and then when I looked at the bottle I realized I don't have any that aren't expired." Patient states that she was previously taking Valtrex only as needed but "When my husband died Debbi recommended that I start taking it daily." Patient would like to know what Regina Eck CNM recommends that she start back on now. "I am interested in starting a new relationship. I know I always need to use condoms. That is what I read." Advised patient will need to use condoms and abstain from intercourse during times of outbreak. Patient is agreeable Advised patient will send a message over to Regina Eck CNM and return call with further recommendations regarding valtrex. Patient is agreeable.

## 2014-03-12 NOTE — Telephone Encounter (Signed)
Pt has a question regarding the herpes virus.

## 2014-03-13 MED ORDER — VALACYCLOVIR HCL 500 MG PO TABS: 500.0000 mg | ORAL_TABLET | Freq: Every day | ORAL | Status: DC

## 2014-03-13 NOTE — Telephone Encounter (Signed)
Patient requesting to know if you recommend that she use Valtrex PRN with outbreak or daily for suppression. Please advise.

## 2014-03-13 NOTE — Telephone Encounter (Signed)
Spoke with patient. Advised patient of message as seen below from Regina Eck CNM. Patient is agreeable and verbalizes understanding. Patient would like to begin continuous Valtrex at this time. Rx for Valtrex 500 mg take one table daily #30 9RF sent to pharmacy on file. Patient is agreeable and verbalizes understanding.  Routing to provider for final review. Patient agreeable to disposition. Will close encounter

## 2014-03-13 NOTE — Telephone Encounter (Signed)
Recommend continuous use due to the virus is shedding before and after outbreak, which increases risks of transmission and reoccurrence of outbreaks

## 2014-03-13 NOTE — Telephone Encounter (Signed)
Pt returning call

## 2014-03-13 NOTE — Telephone Encounter (Signed)
Left message to call Kiaraliz Rafuse at [phone]. 

## 2014-05-24 HISTORY — PX: COLPOSCOPY: SHX161

## 2014-05-27 ENCOUNTER — Other Ambulatory Visit (HOSPITAL_COMMUNITY): Payer: Self-pay | Admitting: Internal Medicine

## 2014-05-27 DIAGNOSIS — M5126 Other intervertebral disc displacement, lumbar region: Secondary | ICD-10-CM

## 2014-06-03 ENCOUNTER — Ambulatory Visit (HOSPITAL_COMMUNITY)
Admission: RE | Admit: 2014-06-03 | Discharge: 2014-06-03 | Disposition: A | Discharge status: Home / Self Care | Payer: 59 | Source: Ambulatory Visit | Attending: Internal Medicine | Admitting: Internal Medicine

## 2014-06-03 DIAGNOSIS — M5127 Other intervertebral disc displacement, lumbosacral region: Secondary | ICD-10-CM | POA: Insufficient documentation

## 2014-06-03 DIAGNOSIS — M5126 Other intervertebral disc displacement, lumbar region: Secondary | ICD-10-CM | POA: Insufficient documentation

## 2014-06-03 DIAGNOSIS — M545 Low back pain: Secondary | ICD-10-CM | POA: Diagnosis present

## 2014-06-25 ENCOUNTER — Other Ambulatory Visit (HOSPITAL_COMMUNITY): Payer: Self-pay | Admitting: Internal Medicine

## 2014-06-25 DIAGNOSIS — J329 Chronic sinusitis, unspecified: Secondary | ICD-10-CM

## 2014-06-28 ENCOUNTER — Ambulatory Visit (HOSPITAL_COMMUNITY)
Admission: RE | Admit: 2014-06-28 | Discharge: 2014-06-28 | Disposition: A | Discharge status: Home / Self Care | Payer: 59 | Source: Ambulatory Visit | Attending: Internal Medicine | Admitting: Internal Medicine

## 2014-06-28 DIAGNOSIS — R51 Headache: Secondary | ICD-10-CM | POA: Insufficient documentation

## 2014-06-28 DIAGNOSIS — J329 Chronic sinusitis, unspecified: Secondary | ICD-10-CM | POA: Insufficient documentation

## 2014-09-26 ENCOUNTER — Ambulatory Visit (HOSPITAL_COMMUNITY)
Admission: RE | Admit: 2014-09-26 | Discharge: 2014-09-26 | Disposition: A | Discharge status: Home / Self Care | Payer: 59 | Source: Ambulatory Visit | Attending: Internal Medicine | Admitting: Internal Medicine

## 2014-09-26 ENCOUNTER — Other Ambulatory Visit (HOSPITAL_COMMUNITY): Payer: Self-pay | Admitting: Internal Medicine

## 2014-09-26 DIAGNOSIS — R221 Localized swelling, mass and lump, neck: Secondary | ICD-10-CM | POA: Insufficient documentation

## 2014-09-26 DIAGNOSIS — R1310 Dysphagia, unspecified: Secondary | ICD-10-CM

## 2014-09-26 DIAGNOSIS — R490 Dysphonia: Secondary | ICD-10-CM | POA: Diagnosis not present

## 2014-09-26 DIAGNOSIS — J0510 Acute epiglottitis without obstruction: Secondary | ICD-10-CM

## 2014-10-17 ENCOUNTER — Other Ambulatory Visit: Payer: Self-pay | Admitting: Certified Nurse Midwife

## 2014-10-17 DIAGNOSIS — Z1231 Encounter for screening mammogram for malignant neoplasm of breast: Secondary | ICD-10-CM

## 2014-11-01 ENCOUNTER — Encounter: Payer: Self-pay | Admitting: Certified Nurse Midwife

## 2014-11-01 ENCOUNTER — Ambulatory Visit (INDEPENDENT_AMBULATORY_CARE_PROVIDER_SITE_OTHER): Payer: 59 | Admitting: Certified Nurse Midwife

## 2014-11-01 VITALS — BP 110/62 | HR 70 | Resp 16 | Ht 65.25 in | Wt 147.0 lb

## 2014-11-01 DIAGNOSIS — A609 Anogenital herpesviral infection, unspecified: Secondary | ICD-10-CM | POA: Diagnosis not present

## 2014-11-01 DIAGNOSIS — Z124 Encounter for screening for malignant neoplasm of cervix: Secondary | ICD-10-CM

## 2014-11-01 DIAGNOSIS — Z01419 Encounter for gynecological examination (general) (routine) without abnormal findings: Secondary | ICD-10-CM | POA: Diagnosis not present

## 2014-11-01 DIAGNOSIS — A6009 Herpesviral infection of other urogenital tract: Secondary | ICD-10-CM

## 2014-11-01 DIAGNOSIS — N952 Postmenopausal atrophic vaginitis: Secondary | ICD-10-CM

## 2014-11-01 DIAGNOSIS — Z0000 Encounter for general adult medical examination without abnormal findings: Secondary | ICD-10-CM

## 2014-11-01 LAB — HEMOGLOBIN, FINGERSTICK: HEMOGLOBIN, FINGERSTICK: 12.7 g/dL (ref 12.0–16.0)

## 2014-11-01 MED ORDER — VALACYCLOVIR HCL 500 MG PO TABS: 500.0000 mg | ORAL_TABLET | Freq: Every day | ORAL | Status: DC

## 2014-11-01 MED ORDER — ESTRADIOL 10 MCG VA TABS: 1.0000 | ORAL_TABLET | VAGINAL | Status: DC

## 2014-11-01 NOTE — Patient Instructions (Signed)

## 2014-11-01 NOTE — Progress Notes (Signed)
59 y.o. G0P0000 Married  Caucasian Fe here for annual exam. Menopausal no HRT. Denies vaginal bleeding or vaginal dryness. Still using Vagifem if needed. No concerns with use. Seeing someone now compassionate person, who new her spouse before now. Now sexually active with some discomfort but using lubrication. Taking care of mother who needs assisted living. Patient does have other siblings to help support. Still attending grief support group, which is helping. No HSV outbreaks in past year. Valtrex working well. Sees PCP for labs and medication management.. No other health issues today.  Patient's last menstrual period was 05/24/2005.          Sexually active: Yes.    The current method of family planning is tubal ligation.    Exercising: Yes.    walking Smoker:  no  Health Maintenance: Pap: 10-25-13 neg MMG:  11-05-13 category b density,birads 1:neg Colonoscopy:  2011 due 2016 BMD:   2010 normal TDaP:  2010 Labs: Poct urine-neg, hgb-12.7 Self breast exam: done monthly   reports that she has quit smoking. She has never used smokeless tobacco. She reports that she drinks about 4.8 oz of alcohol per week. She reports that she does not use illicit drugs.  Past Medical History  Diagnosis Date  . Seasonal allergies   . History of Salmonella infection   . Melanoma of skin, site unspecified   . Irritable bowel syndrome   . Constipation   . Abdominal pain, epigastric   . Shoulder pain   . Rotator cuff syndrome of left shoulder   . HSV infection   . Asthma     as a child  . Arthritis     in back    Past Surgical History  Procedure Laterality Date  . Tubal ligation    . Thermal ablation      of the uterus  . Dilation and curettage of uterus  1997    bleeding  . Mole removal      cancer  . Skin graft      Current Outpatient Prescriptions  Medication Sig Dispense Refill  . Cholecalciferol (VITAMIN D PO) Take by mouth. With magnesium occasionally    . Cyanocobalamin (VITAMIN B-12)  2500 MCG SUBL Place under the tongue daily.     . Estradiol (VAGIFEM) 10 MCG TABS vaginal tablet Place 1 tablet (10 mcg total) vaginally 2 (two) times a week. Use 2 times weekly per vagina 8 tablet 12  . fluticasone (FLONASE) 50 MCG/ACT nasal spray Place into both nostrils daily.    . montelukast (SINGULAIR) 10 MG tablet     . Multiple Vitamins-Minerals (MULTIVITAMIN PO) Take by mouth daily.    . Probiotic Product (PROBIOTIC PO) Take by mouth as needed.    Marland Kitchen UNABLE TO FIND Allergy med    . valACYclovir (VALTREX) 500 MG tablet Take 1 tablet (500 mg total) by mouth daily. 30 tablet 9   No current facility-administered medications for this visit.    Family History  Problem Relation Age of Onset  . Hypertension Mother   . Heart attack Mother   . Fibroids Mother     h/x hysterectomy  . Hypothyroidism Mother   . Diabetes Mother   . Acromegaly Mother   . Hypertension Father   . Kidney Stones Father   . Diabetes Father   . Heart block Father     stint put in  . Diabetes Maternal Grandmother   . Heart failure Maternal Grandmother   . Depression Maternal Grandfather   .  Parkinson's disease Maternal Grandfather   . Cancer Maternal Grandfather     brain  . Leukemia Brother   . Breast cancer Paternal Aunt   . Breast cancer Paternal Aunt     ROS:  Pertinent items are noted in HPI.  Otherwise, a comprehensive ROS was negative.  Exam:   BP 110/62 mmHg  Pulse 70  Resp 16  Ht 5' 5.25" (1.657 m)  Wt 147 lb (66.679 kg)  BMI 24.29 kg/m2  LMP 05/24/2005 Height: 5' 5.25" (165.7 cm) Ht Readings from Last 3 Encounters:  11/01/14 5' 5.25" (1.657 m)  06/03/14 5\' 6"  (1.676 m)  10/25/13 5' 5.5" (1.664 m)    General appearance: alert, cooperative and appears stated age Head: Normocephalic, without obvious abnormality, atraumatic Neck: no adenopathy, supple, symmetrical, trachea midline and thyroid normal to inspection and palpation Lungs: clear to auscultation bilaterally Breasts: normal  appearance, no masses or tenderness, No nipple retraction or dimpling, No nipple discharge or bleeding, No axillary or supraclavicular adenopathy Heart: regular rate and rhythm Abdomen: soft, non-tender; no masses,  no organomegaly Extremities: extremities normal, atraumatic, no cyanosis or edema Skin: Skin color, texture, turgor normal. No rashes or lesions Lymph nodes: Cervical, supraclavicular, and axillary nodes normal. No abnormal inguinal nodes palpated Neurologic: Grossly normal   Pelvic: External genitalia:  no lesions              Urethra:  normal appearing urethra with no masses, tenderness or lesions              Bartholin's and Skene's: normal                 Vagina: normal appearing vagina with normal color and discharge, no lesions              Cervix: normal,non tender, no lesions              Pap taken: Yes.   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  Lab GC, Chlamydia, Hep B,C, HIV,RPR  Encouraged to continue grief counseling and seek friend support as needed.  Rx Vagifem see order  Rx Valtrex see order  Pap smear taken with HPV reflex   counseled on breast self exam, mammography screening, STD prevention, HIV risk factors and prevention, adequate intake of calcium and vitamin D, diet and exercise. Discussed colonoscopy due, patient will call to schedule, had previously. Will advise if needs referral.  return annually or  prn  An After Visit Summary was printed and given to the patient.

## 2014-11-02 LAB — STD PANEL
HEP B S AG: NEGATIVE
HIV: NONREACTIVE

## 2014-11-02 LAB — HEPATITIS C ANTIBODY: HCV AB: NEGATIVE

## 2014-11-03 NOTE — Progress Notes (Signed)
Reviewed personally.  M. Suzanne Rage Beever, MD.  

## 2014-11-04 ENCOUNTER — Other Ambulatory Visit: Payer: Self-pay | Admitting: Certified Nurse Midwife

## 2014-11-04 DIAGNOSIS — R87612 Low grade squamous intraepithelial lesion on cytologic smear of cervix (LGSIL): Secondary | ICD-10-CM

## 2014-11-04 LAB — IPS PAP TEST WITH REFLEX TO HPV

## 2014-11-05 LAB — IPS N GONORRHOEA AND CHLAMYDIA BY PCR

## 2014-11-06 ENCOUNTER — Other Ambulatory Visit: Payer: Self-pay | Admitting: Certified Nurse Midwife

## 2014-11-06 ENCOUNTER — Telehealth: Payer: Self-pay

## 2014-11-06 DIAGNOSIS — R87612 Low grade squamous intraepithelial lesion on cytologic smear of cervix (LGSIL): Secondary | ICD-10-CM

## 2014-11-06 NOTE — Telephone Encounter (Signed)
-----   Message from Regina Eck, CNM sent at 11/04/2014  5:39 PM EDT ----- Notify patient that pap smear is abnormal with LSIL and needs colpo exam. Order in please schedule with me

## 2014-11-06 NOTE — Telephone Encounter (Signed)
Spoke with patient. Advised of results as seen below from [place]. Patient is agreeable and verbalizes understanding. Patient is postmenopausal. Colposcopy scheduled for 11/21/2014 at 2pm with Regina Eck CNM. Patient is agreeable to date and time.  Instructions given. Motrin 800 mg po x , one hour before appointment with food. Make sure to eat a meal before appointment and drink plenty of fluids. Order placed for precert.  Routing to provider for final review. Patient agreeable to disposition. Will close encounter.   Patient aware provider will review message and nurse will return call if any additional advice or change of disposition.

## 2014-11-06 NOTE — Telephone Encounter (Signed)
Left message to call [place] at [phone].  Notes Recorded by Regina Eck, CNM on 11/06/2014 at 8:17 AM Notify patient that HIV,RPR, Hep B,C, GC,Chlamydia are negative Pap smear showed LSIL and needs colposcopy evaluation please schedule order in

## 2014-11-11 ENCOUNTER — Ambulatory Visit (HOSPITAL_COMMUNITY): Payer: 59

## 2014-11-13 ENCOUNTER — Ambulatory Visit (HOSPITAL_COMMUNITY)
Admission: RE | Admit: 2014-11-13 | Discharge: 2014-11-13 | Disposition: A | Discharge status: Home / Self Care | Payer: 59 | Source: Ambulatory Visit | Attending: Certified Nurse Midwife | Admitting: Certified Nurse Midwife

## 2014-11-13 DIAGNOSIS — Z1231 Encounter for screening mammogram for malignant neoplasm of breast: Secondary | ICD-10-CM | POA: Insufficient documentation

## 2014-11-20 ENCOUNTER — Telehealth: Payer: Self-pay | Admitting: Certified Nurse Midwife

## 2014-11-20 NOTE — Telephone Encounter (Signed)
Called patient to review benefits for procedure. Left voicemail to call back and review. Patient responsibility listed in appt desk. Please let patient know this is just an estimate until charges billed. Further questions may be directed to [place] if [place] unavailable.

## 2014-11-21 ENCOUNTER — Encounter: Payer: Self-pay | Admitting: Certified Nurse Midwife

## 2014-11-21 ENCOUNTER — Ambulatory Visit (INDEPENDENT_AMBULATORY_CARE_PROVIDER_SITE_OTHER): Payer: 59 | Admitting: Certified Nurse Midwife

## 2014-11-21 DIAGNOSIS — R87612 Low grade squamous intraepithelial lesion on cytologic smear of cervix (LGSIL): Secondary | ICD-10-CM

## 2014-11-21 NOTE — Progress Notes (Signed)
Pap smear 11-01-14 LGSIL Pt took 400mg  ibuprofen at 12:30pm

## 2014-11-21 NOTE — Progress Notes (Signed)
Patient ID: [name], female   DOB: 1956-04-19, 59 y.o.   MRN: [id number]  Chief Complaint  Patient presents with  . Colposcopy    HPI [name] is a 59 y.o. g0p0 white widowed  female.  Here for colposcopy exam. Denies vaginal bleeding or pelvic pain.  HPI  Indications: Pap smear on 6/10 2016 showed: low-grade squamous intraepithelial neoplasia (LGSIL - encompassing HPV,mild dysplasia,CIN I). Previous colposcopy:none Prior cervical treatment: none.  Past Medical History  Diagnosis Date  . Seasonal allergies   . History of Salmonella infection   . Melanoma of skin, site unspecified   . Irritable bowel syndrome   . Constipation   . Abdominal pain, epigastric   . Shoulder pain   . Rotator cuff syndrome of left shoulder   . HSV infection   . Asthma     as a child  . Arthritis     in back  . Abnormal Pap smear of cervix     11-01-14 LGSIL    Past Surgical History  Procedure Laterality Date  . Tubal ligation    . Thermal ablation      of the uterus  . Dilation and curettage of uterus  1997    bleeding  . Mole removal      cancer  . Skin graft      Family History  Problem Relation Age of Onset  . Hypertension Mother   . Heart attack Mother   . Fibroids Mother     h/x hysterectomy  . Hypothyroidism Mother   . Diabetes Mother   . Acromegaly Mother   . Hypertension Father   . Kidney Stones Father   . Diabetes Father   . Heart block Father     stint put in  . Diabetes Maternal Grandmother   . Heart failure Maternal Grandmother   . Depression Maternal Grandfather   . Parkinson's disease Maternal Grandfather   . Cancer Maternal Grandfather     brain  . Leukemia Brother   . Breast cancer Paternal Aunt   . Breast cancer Paternal Aunt     Social History History  Substance Use Topics  . Smoking status: Former Research scientist (life sciences)  . Smokeless tobacco: Never Used  . Alcohol Use: 4.8 oz/week    8 Standard drinks or equivalent per week     Comment: beer    No Known  Allergies  Current Outpatient Prescriptions  Medication Sig Dispense Refill  . Cholecalciferol (VITAMIN D PO) Take by mouth. With magnesium occasionally    . Cyanocobalamin (VITAMIN B-12) 2500 MCG SUBL Place under the tongue daily.     . Estradiol (VAGIFEM) 10 MCG TABS vaginal tablet Place 1 tablet (10 mcg total) vaginally 2 (two) times a week. Use 2 times weekly per vagina 8 tablet 12  . fluticasone (FLONASE) 50 MCG/ACT nasal spray Place into both nostrils daily.    . montelukast (SINGULAIR) 10 MG tablet     . Multiple Vitamins-Minerals (MULTIVITAMIN PO) Take by mouth daily.    . Probiotic Product (PROBIOTIC PO) Take by mouth as needed.    Marland Kitchen UNABLE TO FIND Allergy shots    . valACYclovir (VALTREX) 500 MG tablet Take 1 tablet (500 mg total) by mouth daily. 30 tablet 12   No current facility-administered medications for this visit.    Review of Systems Review of Systems  Constitutional: Negative.   Genitourinary: Negative for vaginal bleeding, vaginal discharge and vaginal pain.    Blood pressure 122/66, pulse 60, resp.  rate 20, height 5' 5.25" (1.657 m), weight 149 lb (67.586 kg), last menstrual period 05/24/2005.  Physical Exam Physical Exam  Constitutional: She is oriented to person, place, and time. She appears well-developed and well-nourished.  Genitourinary: Vagina normal.    Neurological: She is alert and oriented to person, place, and time.  Skin: Skin is warm and dry.  Psychiatric: She has a normal mood and affect. Her behavior is normal. Judgment and thought content normal.    Data Reviewed Reviewed pap smear results and discussed HPV etiology. Questions addressed.  Assessment  Abnormal pap smear of LSIL here for colposcopy exam Procedure Details  The risks and benefits of the procedure and Written informed consent obtained.  Speculum placed in vagina and excellent visualization of cervix achieved, cervix swabbed x 3 with acetic acid solution. Very small area of  acetowhite response noted at SCJ only, inside cervical os, Lugol's applied with decreased staining noted. ECC obtained. Monsel's applied. Patient tolerated procedure well. No active bleeding noted with speculum removal. Instructions given,  Specimens: 1  Complications: none.     Plan    Specimens labelled and sent to Pathology. Patient will be contacted after pathology reviewed.       LEONARD,DEBORAH 11/21/2014, 2:20 PM

## 2014-11-21 NOTE — Patient Instructions (Signed)

## 2014-11-26 LAB — IPS OTHER TISSUE BIOPSY

## 2014-11-26 NOTE — Progress Notes (Signed)
Reviewed personally.  M. Suzanne Nari Vannatter, MD.  

## 2014-11-29 ENCOUNTER — Telehealth: Payer: Self-pay | Admitting: Emergency Medicine

## 2014-11-29 NOTE — Telephone Encounter (Signed)
Spoke with patient and message from Regina Eck CNM given. Emphasized importance of close follow up with Pap smear in 6 months. Patient agreeable to scheduling and scheduled for 06/26/15 with provider.  06 recall in place. Patient will follow up as scheduled and prn. Routing to provider for final review. Patient agreeable to disposition. Will close encounter.

## 2014-11-29 NOTE — Telephone Encounter (Signed)
-----   Message from Regina Eck, CNM sent at 11/28/2014  8:51 AM EDT ----- Notify patient that ECC showed HPV effect and benign endocervical glandular tissue. Needs repeat pap in 6 months. Pap recall. 06

## 2015-02-27 ENCOUNTER — Other Ambulatory Visit: Payer: Self-pay | Admitting: Dermatology

## 2015-05-12 ENCOUNTER — Other Ambulatory Visit (HOSPITAL_COMMUNITY): Payer: Self-pay | Admitting: Family Medicine

## 2015-05-12 ENCOUNTER — Ambulatory Visit (HOSPITAL_COMMUNITY)
Admission: RE | Admit: 2015-05-12 | Discharge: 2015-05-12 | Disposition: A | Discharge status: Home / Self Care | Payer: 59 | Source: Ambulatory Visit | Attending: Family Medicine | Admitting: Family Medicine

## 2015-05-12 DIAGNOSIS — J302 Other seasonal allergic rhinitis: Secondary | ICD-10-CM | POA: Diagnosis present

## 2015-05-12 DIAGNOSIS — J329 Chronic sinusitis, unspecified: Secondary | ICD-10-CM | POA: Insufficient documentation

## 2015-06-26 ENCOUNTER — Ambulatory Visit (INDEPENDENT_AMBULATORY_CARE_PROVIDER_SITE_OTHER): Payer: Commercial Managed Care - HMO | Admitting: Certified Nurse Midwife

## 2015-06-26 ENCOUNTER — Encounter: Payer: Self-pay | Admitting: Certified Nurse Midwife

## 2015-06-26 VITALS — BP 112/64 | HR 72 | Resp 16 | Ht 65.25 in | Wt 159.0 lb

## 2015-06-26 DIAGNOSIS — R87612 Low grade squamous intraepithelial lesion on cytologic smear of cervix (LGSIL): Secondary | ICD-10-CM | POA: Diagnosis not present

## 2015-06-26 DIAGNOSIS — Z124 Encounter for screening for malignant neoplasm of cervix: Secondary | ICD-10-CM

## 2015-06-26 NOTE — Progress Notes (Signed)
Reviewed personally.  M. Suzanne Alaiah Lundy, MD.  

## 2015-06-26 NOTE — Progress Notes (Signed)
.  60 y.o. G0P0000 Widowed Caucasian  F here for repeat Pap smear due to history of low-grade squamous intraepithelial neoplasia (LGSIL - encompassing HPV,mild dysplasia,CIN I) on pap done 11/01/14 and HPV effect, benign endocervical glandular tissue. on colpo done 11/21/14. Patient denies vaginal bleeding or pelvic pain or STD concerns. No health issues today.  Patient's last menstrual period was 05/24/2005.Marland Kitchen  Contraception:Post menopausal  EXAM: BP 112/64 mmHg  Pulse 72  Resp 16  Ht 5' 5.25" (1.657 m)  Wt 159 lb (72.122 kg)  BMI 26.27 kg/m2  LMP 05/24/2005 General appearance:  WNWD Female, NAD Pelvic exam: VULVA: normal appearing vulva with no masses, tenderness or lesions, VAGINA: normal appearing vagina with normal color and discharge, no lesions, moisture present now, CERVIX: normal appearing cervix without discharge or lesions, UTERUS: uterus is normal size, shape, consistency and nontender, ADNEXA: normal adnexa in size, nontender and no masses. Pap smear obtained.  Assessment: History of low-grade squamous intraepithelial neoplasia (LGSIL - encompassing HPV,mild dysplasia,CIN I)  Repeat pap today.  Plan: Repeat Pap 6 months with aex unless otherwise indicated by results. Discussed importance of follow up as indicated. Questions addressed. Patient will be notified of results. Continue coconut oil use for dryness.  Rv aex,prn

## 2015-06-30 LAB — IPS PAP TEST WITH HPV

## 2015-07-01 ENCOUNTER — Telehealth: Payer: Self-pay

## 2015-07-01 NOTE — Telephone Encounter (Signed)
Lmtcb.

## 2015-07-01 NOTE — Telephone Encounter (Signed)
Patient returning call.

## 2015-07-01 NOTE — Telephone Encounter (Signed)
-----   Message from Regina Eck, CNM sent at 07/01/2015  1:19 PM EST ----- Notify patient that pap smear showed ASCUS HPVHR not detected. Pap recall 08 repeat with aex this year

## 2015-07-02 NOTE — Telephone Encounter (Signed)
Patient notified of results as written by provider 

## 2015-11-03 ENCOUNTER — Other Ambulatory Visit: Payer: Self-pay | Admitting: Certified Nurse Midwife

## 2015-11-03 NOTE — Telephone Encounter (Signed)
Medication refill request: Valacyclovir 500mg  tabs Last AEX:  11/01/14 DL Next AEX: 11/11/15 Last MMG (if hormonal medication request): 11/13/14 BIRADS1 Refill authorized: Valacyclovir 500mg  #30 12R. Please advise. Thank you.

## 2015-11-11 ENCOUNTER — Encounter: Payer: Self-pay | Admitting: Certified Nurse Midwife

## 2015-11-11 ENCOUNTER — Ambulatory Visit (INDEPENDENT_AMBULATORY_CARE_PROVIDER_SITE_OTHER): Payer: Commercial Managed Care - HMO | Admitting: Certified Nurse Midwife

## 2015-11-11 VITALS — BP 110/64 | HR 68 | Resp 16 | Ht 65.5 in | Wt 158.0 lb

## 2015-11-11 DIAGNOSIS — Z124 Encounter for screening for malignant neoplasm of cervix: Secondary | ICD-10-CM | POA: Diagnosis not present

## 2015-11-11 DIAGNOSIS — R6882 Decreased libido: Secondary | ICD-10-CM

## 2015-11-11 DIAGNOSIS — Z01419 Encounter for gynecological examination (general) (routine) without abnormal findings: Secondary | ICD-10-CM

## 2015-11-11 DIAGNOSIS — Z0000 Encounter for general adult medical examination without abnormal findings: Secondary | ICD-10-CM

## 2015-11-11 DIAGNOSIS — N952 Postmenopausal atrophic vaginitis: Secondary | ICD-10-CM | POA: Diagnosis not present

## 2015-11-11 LAB — POCT URINALYSIS DIPSTICK
BILIRUBIN UA: NEGATIVE
Blood, UA: NEGATIVE
Glucose, UA: NEGATIVE
KETONES UA: NEGATIVE
LEUKOCYTES UA: NEGATIVE
Nitrite, UA: NEGATIVE
PROTEIN UA: NEGATIVE
Urobilinogen, UA: NEGATIVE
pH, UA: 5

## 2015-11-11 LAB — COMPREHENSIVE METABOLIC PANEL
ALK PHOS: 90 U/L (ref 33–130)
ALT: 10 U/L (ref 6–29)
AST: 14 U/L (ref 10–35)
Albumin: 4.2 g/dL (ref 3.6–5.1)
BILIRUBIN TOTAL: 0.5 mg/dL (ref 0.2–1.2)
BUN: 10 mg/dL (ref 7–25)
CO2: 27 mmol/L (ref 20–31)
CREATININE: 0.65 mg/dL (ref 0.50–1.05)
Calcium: 9.4 mg/dL (ref 8.6–10.4)
Chloride: 102 mmol/L (ref 98–110)
GLUCOSE: 85 mg/dL (ref 65–99)
Potassium: 4.7 mmol/L (ref 3.5–5.3)
Sodium: 137 mmol/L (ref 135–146)
Total Protein: 6.5 g/dL (ref 6.1–8.1)

## 2015-11-11 LAB — LIPID PANEL
CHOL/HDL RATIO: 2.2 ratio (ref <=5.0)
Cholesterol: 186 mg/dL (ref 125–200)
HDL: 84 mg/dL (ref >=46)
LDL Cholesterol: 79 mg/dL (ref <130)
Triglycerides: 114 mg/dL (ref <150)
VLDL: 23 mg/dL (ref <30)

## 2015-11-11 LAB — TSH: TSH: 3.06 mIU/L

## 2015-11-11 LAB — HEMOGLOBIN, FINGERSTICK: Hemoglobin, fingerstick: 13.2 g/dL (ref 12.0–16.0)

## 2015-11-11 MED ORDER — VALACYCLOVIR HCL 500 MG PO TABS: 500.0000 mg | ORAL_TABLET | Freq: Every day | ORAL | Status: DC

## 2015-11-11 MED ORDER — ESTRADIOL 10 MCG VA TABS: 1.0000 | ORAL_TABLET | VAGINAL | Status: DC

## 2015-11-11 NOTE — Patient Instructions (Signed)

## 2015-11-11 NOTE — Progress Notes (Signed)
60 y.o. G0P0000 Widowed  Caucasian Fe here for annual exam. Denies vaginal bleeding.Vagifem and coconut oil working well for vaginal dryness. No STD concerns or testing. Patient had Steroid dose pack in 1/17 for sinusitis and gained weight and lost sex drive at the same time. Working on weight loss aware of alcohol being empty calories. Continues allergy injections and medications. Patient feels this has contributing to the last 3 months of feeling bad. Continues follow up with MD.. Needs refill on Valtrex. Sees PCP Dr. Hilma Favors prn. No other health issues today. Leaving for trip to [place] and [place] soon!  Patient's last menstrual period was 05/24/2005.          Sexually active: Yes.    The current method of family planning is tubal ligation.    Exercising: Yes.    walking & dancing Smoker:  no  Health Maintenance: Pap:  11-01-14 LGSIL, colpo HPV effect & benign endocervical glandular tissue, 06-26-15 ASCUS HPV HR neg MMG:  11-13-14 category c density, birads 1:neg, will schedule Colonoscopy:  2009 f/u 21yrs BMD:   2010 TDaP:  2010 Shingles: no Pneumonia: no Hep C and HIV: both neg 2016 Labs: poct urine-neg, hgb-13.2 Self breast exam: done occ   reports that she has quit smoking. She has never used smokeless tobacco. She reports that she drinks about 7.2 oz of alcohol per week. She reports that she does not use illicit drugs.  Past Medical History  Diagnosis Date  . Seasonal allergies   . History of Salmonella infection   . Melanoma of skin, site unspecified   . Irritable bowel syndrome   . Constipation   . Abdominal pain, epigastric   . Shoulder pain   . Rotator cuff syndrome of left shoulder   . HSV infection   . Asthma     as a child  . Arthritis     in back  . Abnormal Pap smear of cervix     11-01-14 LGSIL    Past Surgical History  Procedure Laterality Date  . Tubal ligation    . Thermal ablation      of the uterus  . Dilation and curettage of uterus  1997     bleeding  . Mole removal      cancer  . Skin graft    . Colposcopy  2016    Current Outpatient Prescriptions  Medication Sig Dispense Refill  . Cyanocobalamin (VITAMIN B-12) 2500 MCG SUBL Place under the tongue daily.     . montelukast (SINGULAIR) 10 MG tablet as needed.     Marland Kitchen UNABLE TO FIND Allergy shots    . valACYclovir (VALTREX) 500 MG tablet TAKE ONE TABLET BY MOUTH ONCE DAILY. 30 tablet 0  . Estradiol (VAGIFEM) 10 MCG TABS vaginal tablet Place 1 tablet (10 mcg total) vaginally 2 (two) times a week. Use 2 times weekly per vagina (Patient not taking: Reported on 11/11/2015) 8 tablet 12   No current facility-administered medications for this visit.    Family History  Problem Relation Age of Onset  . Hypertension Mother   . Heart attack Mother   . Fibroids Mother     h/x hysterectomy  . Hypothyroidism Mother   . Diabetes Mother   . Acromegaly Mother   . Hypertension Father   . Kidney Stones Father   . Diabetes Father   . Heart block Father     stint put in  . Diabetes Maternal Grandmother   . Heart failure Maternal Grandmother   .  Depression Maternal Grandfather   . Parkinson's disease Maternal Grandfather   . Cancer Maternal Grandfather     brain  . Leukemia Brother   . Breast cancer Paternal Aunt   . Breast cancer Paternal Aunt     ROS:  Pertinent items are noted in HPI.  Otherwise, a comprehensive ROS was negative.  Exam:   BP 110/64 mmHg  Pulse 68  Resp 16  Ht 5' 5.5" (1.664 m)  Wt 158 lb (71.668 kg)  BMI 25.88 kg/m2  LMP 05/24/2005 Height: 5' 5.5" (166.4 cm) Ht Readings from Last 3 Encounters:  11/11/15 5' 5.5" (1.664 m)  06/26/15 5' 5.25" (1.657 m)  11/21/14 5' 5.25" (1.657 m)    General appearance: alert, cooperative and appears stated age Head: Normocephalic, without obvious abnormality, atraumatic Neck: no adenopathy, supple, symmetrical, trachea midline and thyroid normal to inspection and palpation Lungs: clear to auscultation  bilaterally Breasts: normal appearance, no masses or tenderness, No nipple retraction or dimpling, No nipple discharge or bleeding, No axillary or supraclavicular adenopathy Heart: regular rate and rhythm Abdomen: soft, non-tender; no masses,  no organomegaly Extremities: extremities normal, atraumatic, no cyanosis or edema Skin: Skin color, texture, turgor normal. No rashes or lesions Lymph nodes: Cervical, supraclavicular, and axillary nodes normal. No abnormal inguinal nodes palpated Neurologic: Grossly normal   Pelvic: External genitalia:  no lesions, normal female              Urethra:  normal appearing urethra with no masses, tenderness or lesions              Bartholin's and Skene's: normal                 Vagina: normal appearing vagina with normal color and discharge, no lesions, slight increase pink at introitus only, dryness noted              Cervix: multiparous appearance, no lesions and normal              Pap taken: Yes.   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  History of HSV, needs refill of Valtrex  Decreased libido  Seasonal allergies severe for past year with Allergist follow up  Screening labs  P:   Reviewed health and wellness pertinent to exam  Aware of need to evaluate if vaginal bleeding  Rx Vagifem see order with instructions  Rx Valtrex see order with instructions  Discussed testosterone use, risks/benefits and use. Questions addressed. Lab Testosterone. Will decide if she would use after lab.  Continue follow up with MD as recommended  Labs:  TSH,  Vitamin D,Lipid panel, CMP  Pap smear as above with HPVHR repeat in one year if negative, if not per results.   counseled on breast self exam, mammography screening, adequate intake of calcium and vitamin D, diet and exercise return annually or prn  An After Visit Summary was printed and given to  the patient.

## 2015-11-12 LAB — VITAMIN D 25 HYDROXY (VIT D DEFICIENCY, FRACTURES): Vit D, 25-Hydroxy: 36 ng/mL (ref 30–100)

## 2015-11-12 NOTE — Progress Notes (Signed)
Reviewed personally.  M. Suzanne Prithvi Kooi, MD.  

## 2015-11-14 ENCOUNTER — Other Ambulatory Visit (HOSPITAL_COMMUNITY): Payer: Self-pay | Admitting: Family Medicine

## 2015-11-14 DIAGNOSIS — Z139 Encounter for screening, unspecified: Secondary | ICD-10-CM

## 2015-11-14 LAB — IPS PAP TEST WITH HPV

## 2015-11-17 ENCOUNTER — Ambulatory Visit (HOSPITAL_COMMUNITY)
Admission: RE | Admit: 2015-11-17 | Discharge: 2015-11-17 | Disposition: A | Discharge status: Home / Self Care | Payer: Commercial Managed Care - HMO | Source: Ambulatory Visit | Attending: Family Medicine | Admitting: Family Medicine

## 2015-11-17 ENCOUNTER — Ambulatory Visit (HOSPITAL_COMMUNITY): Payer: 59

## 2015-11-17 DIAGNOSIS — Z1231 Encounter for screening mammogram for malignant neoplasm of breast: Secondary | ICD-10-CM | POA: Diagnosis present

## 2015-11-17 DIAGNOSIS — Z139 Encounter for screening, unspecified: Secondary | ICD-10-CM

## 2015-11-17 DIAGNOSIS — Z1239 Encounter for other screening for malignant neoplasm of breast: Secondary | ICD-10-CM | POA: Diagnosis not present

## 2015-12-02 ENCOUNTER — Other Ambulatory Visit: Payer: Self-pay | Admitting: Certified Nurse Midwife

## 2015-12-02 ENCOUNTER — Telehealth: Payer: Self-pay | Admitting: Certified Nurse Midwife

## 2015-12-02 DIAGNOSIS — T3695XA Adverse effect of unspecified systemic antibiotic, initial encounter: Principal | ICD-10-CM

## 2015-12-02 DIAGNOSIS — B379 Candidiasis, unspecified: Secondary | ICD-10-CM

## 2015-12-02 MED ORDER — FLUCONAZOLE 150 MG PO TABS: 150.0000 mg | ORAL_TABLET | Freq: Once | ORAL | Status: DC

## 2015-12-02 NOTE — Telephone Encounter (Signed)
Patient called and said, "I recently took some antibiotics for a tooth. Now I have a yeast infection and need Debbi to call something in for me."  Patient declined an appointment today and is "leaving for [place] tomorrow."  Pharmacy on file is correct, if needed.

## 2015-12-02 NOTE — Telephone Encounter (Signed)
Spoke with patient. Patient states that she was on Amoxicillin because she needed to have a root canal. She has completed her antibiotic and is now experiencing vaginal itching with thick white discharge. Reports this began 1 day ago. States she gets yeast infections frequently with antibiotics and used to have a standing prescription, but has run out of refills. She is requesting rx for Diflucan as she is going to [place] tomorrow for 17 days. States OTC products do not work well for her. Advised we typically do not treat over the phone, but that I will speak with Melvia Heaps CNM and return call. She is agreeable.

## 2015-12-02 NOTE — Telephone Encounter (Signed)
I put will put in order for Diflucan

## 2015-12-02 NOTE — Telephone Encounter (Signed)
Spoke with patient. Advised Melvia Heaps CNM has sent rx for Diflucan to pharmacy on file. She is agreeable.  Routing to provider for final review. Patient agreeable to disposition. Will close encounter.

## 2016-05-25 DIAGNOSIS — J3081 Allergic rhinitis due to animal (cat) (dog) hair and dander: Secondary | ICD-10-CM | POA: Diagnosis not present

## 2016-05-25 DIAGNOSIS — J301 Allergic rhinitis due to pollen: Secondary | ICD-10-CM | POA: Diagnosis not present

## 2016-05-25 DIAGNOSIS — J3089 Other allergic rhinitis: Secondary | ICD-10-CM | POA: Diagnosis not present

## 2016-06-15 DIAGNOSIS — J301 Allergic rhinitis due to pollen: Secondary | ICD-10-CM | POA: Diagnosis not present

## 2016-06-15 DIAGNOSIS — J3089 Other allergic rhinitis: Secondary | ICD-10-CM | POA: Diagnosis not present

## 2016-06-15 DIAGNOSIS — J3081 Allergic rhinitis due to animal (cat) (dog) hair and dander: Secondary | ICD-10-CM | POA: Diagnosis not present

## 2016-06-24 DIAGNOSIS — J3089 Other allergic rhinitis: Secondary | ICD-10-CM | POA: Diagnosis not present

## 2016-06-29 DIAGNOSIS — J3089 Other allergic rhinitis: Secondary | ICD-10-CM | POA: Diagnosis not present

## 2016-06-29 DIAGNOSIS — J301 Allergic rhinitis due to pollen: Secondary | ICD-10-CM | POA: Diagnosis not present

## 2016-06-29 DIAGNOSIS — J3081 Allergic rhinitis due to animal (cat) (dog) hair and dander: Secondary | ICD-10-CM | POA: Diagnosis not present

## 2016-07-13 DIAGNOSIS — J301 Allergic rhinitis due to pollen: Secondary | ICD-10-CM | POA: Diagnosis not present

## 2016-07-13 DIAGNOSIS — J3089 Other allergic rhinitis: Secondary | ICD-10-CM | POA: Diagnosis not present

## 2016-07-13 DIAGNOSIS — J3081 Allergic rhinitis due to animal (cat) (dog) hair and dander: Secondary | ICD-10-CM | POA: Diagnosis not present

## 2016-07-19 DIAGNOSIS — J301 Allergic rhinitis due to pollen: Secondary | ICD-10-CM | POA: Diagnosis not present

## 2016-07-19 DIAGNOSIS — J3081 Allergic rhinitis due to animal (cat) (dog) hair and dander: Secondary | ICD-10-CM | POA: Diagnosis not present

## 2016-07-20 DIAGNOSIS — J3081 Allergic rhinitis due to animal (cat) (dog) hair and dander: Secondary | ICD-10-CM | POA: Diagnosis not present

## 2016-07-20 DIAGNOSIS — J3089 Other allergic rhinitis: Secondary | ICD-10-CM | POA: Diagnosis not present

## 2016-07-20 DIAGNOSIS — J301 Allergic rhinitis due to pollen: Secondary | ICD-10-CM | POA: Diagnosis not present

## 2016-07-29 DIAGNOSIS — J301 Allergic rhinitis due to pollen: Secondary | ICD-10-CM | POA: Diagnosis not present

## 2016-07-29 DIAGNOSIS — J3081 Allergic rhinitis due to animal (cat) (dog) hair and dander: Secondary | ICD-10-CM | POA: Diagnosis not present

## 2016-07-29 DIAGNOSIS — J3089 Other allergic rhinitis: Secondary | ICD-10-CM | POA: Diagnosis not present

## 2016-08-03 DIAGNOSIS — J3081 Allergic rhinitis due to animal (cat) (dog) hair and dander: Secondary | ICD-10-CM | POA: Diagnosis not present

## 2016-08-03 DIAGNOSIS — J3089 Other allergic rhinitis: Secondary | ICD-10-CM | POA: Diagnosis not present

## 2016-08-03 DIAGNOSIS — J301 Allergic rhinitis due to pollen: Secondary | ICD-10-CM | POA: Diagnosis not present

## 2016-08-10 DIAGNOSIS — J3081 Allergic rhinitis due to animal (cat) (dog) hair and dander: Secondary | ICD-10-CM | POA: Diagnosis not present

## 2016-08-10 DIAGNOSIS — J3089 Other allergic rhinitis: Secondary | ICD-10-CM | POA: Diagnosis not present

## 2016-08-10 DIAGNOSIS — J301 Allergic rhinitis due to pollen: Secondary | ICD-10-CM | POA: Diagnosis not present

## 2016-08-17 DIAGNOSIS — J3081 Allergic rhinitis due to animal (cat) (dog) hair and dander: Secondary | ICD-10-CM | POA: Diagnosis not present

## 2016-08-17 DIAGNOSIS — J3089 Other allergic rhinitis: Secondary | ICD-10-CM | POA: Diagnosis not present

## 2016-08-17 DIAGNOSIS — J301 Allergic rhinitis due to pollen: Secondary | ICD-10-CM | POA: Diagnosis not present

## 2016-08-24 DIAGNOSIS — J301 Allergic rhinitis due to pollen: Secondary | ICD-10-CM | POA: Diagnosis not present

## 2016-08-24 DIAGNOSIS — J3089 Other allergic rhinitis: Secondary | ICD-10-CM | POA: Diagnosis not present

## 2016-08-24 DIAGNOSIS — J3081 Allergic rhinitis due to animal (cat) (dog) hair and dander: Secondary | ICD-10-CM | POA: Diagnosis not present

## 2016-08-31 DIAGNOSIS — J3089 Other allergic rhinitis: Secondary | ICD-10-CM | POA: Diagnosis not present

## 2016-08-31 DIAGNOSIS — J3081 Allergic rhinitis due to animal (cat) (dog) hair and dander: Secondary | ICD-10-CM | POA: Diagnosis not present

## 2016-08-31 DIAGNOSIS — J301 Allergic rhinitis due to pollen: Secondary | ICD-10-CM | POA: Diagnosis not present

## 2016-09-10 DIAGNOSIS — J3089 Other allergic rhinitis: Secondary | ICD-10-CM | POA: Diagnosis not present

## 2016-09-10 DIAGNOSIS — J301 Allergic rhinitis due to pollen: Secondary | ICD-10-CM | POA: Diagnosis not present

## 2016-09-10 DIAGNOSIS — J3081 Allergic rhinitis due to animal (cat) (dog) hair and dander: Secondary | ICD-10-CM | POA: Diagnosis not present

## 2016-09-20 DIAGNOSIS — H43393 Other vitreous opacities, bilateral: Secondary | ICD-10-CM | POA: Diagnosis not present

## 2016-09-21 DIAGNOSIS — J3081 Allergic rhinitis due to animal (cat) (dog) hair and dander: Secondary | ICD-10-CM | POA: Diagnosis not present

## 2016-09-21 DIAGNOSIS — J301 Allergic rhinitis due to pollen: Secondary | ICD-10-CM | POA: Diagnosis not present

## 2016-09-21 DIAGNOSIS — J3089 Other allergic rhinitis: Secondary | ICD-10-CM | POA: Diagnosis not present

## 2016-10-08 DIAGNOSIS — J301 Allergic rhinitis due to pollen: Secondary | ICD-10-CM | POA: Diagnosis not present

## 2016-10-08 DIAGNOSIS — J3089 Other allergic rhinitis: Secondary | ICD-10-CM | POA: Diagnosis not present

## 2016-10-08 DIAGNOSIS — J3081 Allergic rhinitis due to animal (cat) (dog) hair and dander: Secondary | ICD-10-CM | POA: Diagnosis not present

## 2016-10-19 DIAGNOSIS — J3089 Other allergic rhinitis: Secondary | ICD-10-CM | POA: Diagnosis not present

## 2016-10-19 DIAGNOSIS — J3081 Allergic rhinitis due to animal (cat) (dog) hair and dander: Secondary | ICD-10-CM | POA: Diagnosis not present

## 2016-10-19 DIAGNOSIS — J301 Allergic rhinitis due to pollen: Secondary | ICD-10-CM | POA: Diagnosis not present

## 2016-10-27 DIAGNOSIS — J3089 Other allergic rhinitis: Secondary | ICD-10-CM | POA: Diagnosis not present

## 2016-10-27 DIAGNOSIS — J301 Allergic rhinitis due to pollen: Secondary | ICD-10-CM | POA: Diagnosis not present

## 2016-10-27 DIAGNOSIS — J3081 Allergic rhinitis due to animal (cat) (dog) hair and dander: Secondary | ICD-10-CM | POA: Diagnosis not present

## 2016-11-09 DIAGNOSIS — J3089 Other allergic rhinitis: Secondary | ICD-10-CM | POA: Diagnosis not present

## 2016-11-09 DIAGNOSIS — J3081 Allergic rhinitis due to animal (cat) (dog) hair and dander: Secondary | ICD-10-CM | POA: Diagnosis not present

## 2016-11-09 DIAGNOSIS — J301 Allergic rhinitis due to pollen: Secondary | ICD-10-CM | POA: Diagnosis not present

## 2016-11-11 ENCOUNTER — Ambulatory Visit: Payer: Commercial Managed Care - HMO | Admitting: Certified Nurse Midwife

## 2016-11-11 ENCOUNTER — Other Ambulatory Visit (HOSPITAL_COMMUNITY): Payer: Self-pay | Admitting: Family Medicine

## 2016-11-11 DIAGNOSIS — Z1231 Encounter for screening mammogram for malignant neoplasm of breast: Secondary | ICD-10-CM

## 2016-11-12 ENCOUNTER — Ambulatory Visit: Payer: Commercial Managed Care - HMO | Admitting: Certified Nurse Midwife

## 2016-11-18 ENCOUNTER — Ambulatory Visit (HOSPITAL_COMMUNITY): Payer: 59

## 2016-11-18 ENCOUNTER — Ambulatory Visit (HOSPITAL_COMMUNITY)
Admission: RE | Admit: 2016-11-18 | Discharge: 2016-11-18 | Disposition: A | Discharge status: Home / Self Care | Payer: 59 | Source: Ambulatory Visit | Attending: Family Medicine | Admitting: Family Medicine

## 2016-11-18 DIAGNOSIS — Z1231 Encounter for screening mammogram for malignant neoplasm of breast: Secondary | ICD-10-CM | POA: Diagnosis present

## 2016-11-19 ENCOUNTER — Ambulatory Visit (INDEPENDENT_AMBULATORY_CARE_PROVIDER_SITE_OTHER): Payer: 59 | Admitting: Certified Nurse Midwife

## 2016-11-19 ENCOUNTER — Encounter: Payer: Self-pay | Admitting: Certified Nurse Midwife

## 2016-11-19 VITALS — BP 116/72 | HR 70 | Resp 16 | Ht 65.25 in | Wt 152.0 lb

## 2016-11-19 DIAGNOSIS — J3081 Allergic rhinitis due to animal (cat) (dog) hair and dander: Secondary | ICD-10-CM | POA: Diagnosis not present

## 2016-11-19 DIAGNOSIS — Z01419 Encounter for gynecological examination (general) (routine) without abnormal findings: Secondary | ICD-10-CM

## 2016-11-19 DIAGNOSIS — J301 Allergic rhinitis due to pollen: Secondary | ICD-10-CM | POA: Diagnosis not present

## 2016-11-19 DIAGNOSIS — N952 Postmenopausal atrophic vaginitis: Secondary | ICD-10-CM | POA: Diagnosis not present

## 2016-11-19 DIAGNOSIS — J3089 Other allergic rhinitis: Secondary | ICD-10-CM | POA: Diagnosis not present

## 2016-11-19 DIAGNOSIS — R6882 Decreased libido: Secondary | ICD-10-CM

## 2016-11-19 MED ORDER — ESTRADIOL 10 MCG VA TABS: ORAL_TABLET | VAGINAL | 12 refills | Status: DC

## 2016-11-19 NOTE — Progress Notes (Signed)
61 y.o. G0P0000 Widowed  Caucasian Fe here for annual exam. Menopausal no HRT. Vaginal dryness being treated with Vagifem working well.  Denies vaginal bleeding. Social stress with mother and dad being hospitalized. All stable, but needs assistance now. Has sisters to help her with care. Frustrated with no libido. Has tried OTC options with no change. Has partner, no STD concerns. Has read about testosterone and would like to have checked and ? Use if needed.  Sees PCP for aex, labs,asthma management, has not been in yet this year. No HSV outbreaks recently. No other health concerns today. Needs a vacation!  Patient's last menstrual period was 05/24/2005.          Sexually active: Yes.    The current method of family planning is tubal ligation.    Exercising: No.  exercise Smoker:  no  Health Maintenance: Pap:  06-26-15 ASCUS HPV HR neg, 11-11-15 neg HPV HR neg History of Abnormal Pap: yes MMG:  11-18-16 category b density birads 1:neg Self Breast exams: yes Colonoscopy:  2009 f/u 67yrs BMD:   2010 TDaP:  2010 Shingles: no Pneumonia: no  Hep C and HIV: both neg 2016 Labs: none   reports that she has quit smoking. She has never used smokeless tobacco. She reports that she drinks about 9.0 oz of alcohol per week . She reports that she does not use drugs.  Past Medical History:  Diagnosis Date  . Abdominal pain, epigastric   . Abnormal Pap smear of cervix    11-01-14 LGSIL  . Arthritis    in back  . Asthma    as a child  . Constipation   . History of Salmonella infection   . HSV infection   . Irritable bowel syndrome   . Melanoma of skin, site unspecified   . Rotator cuff syndrome of left shoulder   . Seasonal allergies   . Shoulder pain     Past Surgical History:  Procedure Laterality Date  . COLPOSCOPY  2016  . DILATION AND CURETTAGE OF UTERUS  1997   bleeding  . MOLE REMOVAL     cancer  . SKIN GRAFT    . Thermal Ablation     of the uterus  . TUBAL LIGATION       Current Outpatient Prescriptions  Medication Sig Dispense Refill  . Cyanocobalamin (VITAMIN B-12) 2500 MCG SUBL Place under the tongue daily.     . Estradiol (VAGIFEM) 10 MCG TABS vaginal tablet Place 1 tablet (10 mcg total) vaginally 2 (two) times a week. Use 2 times weekly per vagina 8 tablet 12  . montelukast (SINGULAIR) 10 MG tablet as needed.     Marland Kitchen UNABLE TO FIND Allergy shots    . valACYclovir (VALTREX) 500 MG tablet Take 1 tablet (500 mg total) by mouth daily. 30 tablet 2   No current facility-administered medications for this visit.     Family History  Problem Relation Age of Onset  . Hypertension Mother   . Heart attack Mother   . Fibroids Mother        h/x hysterectomy  . Hypothyroidism Mother   . Diabetes Mother   . Acromegaly Mother   . Hypertension Father   . Kidney Stones Father   . Diabetes Father   . Heart block Father        stint put in  . Diabetes Maternal Grandmother   . Heart failure Maternal Grandmother   . Depression Maternal Grandfather   . Parkinson's disease  Maternal Grandfather   . Cancer Maternal Grandfather        brain  . Leukemia Brother   . Breast cancer Paternal Aunt   . Breast cancer Paternal Aunt     ROS:  Pertinent items are noted in HPI.  Otherwise, a comprehensive ROS was negative.  Exam:   BP 116/72   Pulse 70   Resp 16   Ht 5' 5.25" (1.657 m)   Wt 152 lb (68.9 kg)   LMP 05/24/2005   BMI 25.10 kg/m  Height: 5' 5.25" (165.7 cm) Ht Readings from Last 3 Encounters:  11/19/16 5' 5.25" (1.657 m)  11/11/15 5' 5.5" (1.664 m)  06/26/15 5' 5.25" (1.657 m)    General appearance: alert, cooperative and appears stated age Head: Normocephalic, without obvious abnormality, atraumatic Neck: no adenopathy, supple, symmetrical, trachea midline and thyroid normal to inspection and palpation Lungs: clear to auscultation bilaterally Breasts: normal appearance, no masses or tenderness, No nipple retraction or dimpling, No nipple  discharge or bleeding, No axillary or supraclavicular adenopathy Heart: regular rate and rhythm Abdomen: soft, non-tender; no masses,  no organomegaly Extremities: extremities normal, atraumatic, no cyanosis or edema Skin: Skin color, texture, turgor normal. No rashes or lesions Lymph nodes: Cervical, supraclavicular, and axillary nodes normal. No abnormal inguinal nodes palpated Neurologic: Grossly normal   Pelvic: External genitalia:  no lesions              Urethra:  normal appearing urethra with no masses, tenderness or lesions              Bartholin's and Skene's: normal                 Vagina: atrophic appearing vagina with normal color and discharge, no lesions, Vagifem residue  noted              Cervix: no cervical motion tenderness, no lesions and normal appearance              Pap taken: No. Bimanual Exam:  Uterus:  normal size, contour, position, consistency, mobility, non-tender              Adnexa: normal adnexa and no mass, fullness, tenderness               Rectovaginal: Confirms               Anus:  normal sphincter tone, no lesions  Chaperone present: yes  A:  Well Woman with normal exam  Menopausal no HRT  Atrophic vaginitis with Vagifem use working well, desires continuance  Decreased libido, would like to try testosterone if indicated  Social stress with parents aging, but has good support  P:   Reviewed health and wellness pertinent to exam  Aware of need to advise if vaginal bleeding  Discussed risks/benefits of use of Vagifem and would like to continue  Rx Vagifem see order  Discussed risks/benefits of testosterone and use of.  Questions addressed.  Lab: Female testosterone, total ,free   Pap smear: no   counseled on breast self exam, mammography screening, adequate intake of calcium and vitamin D, diet and exercise  return annually or prn  An After Visit Summary was printed and given to the patient.

## 2016-11-19 NOTE — Patient Instructions (Signed)

## 2016-11-22 LAB — TESTT+TESTF+SHBG
Sex Hormone Binding: 77.8 nmol/L (ref 17.3–125.0)
Testosterone, Free: 0.3 pg/mL (ref 0.0–4.2)
Testosterone, total: 7.2 ng/dL

## 2016-11-23 ENCOUNTER — Ambulatory Visit: Payer: Commercial Managed Care - HMO | Admitting: Certified Nurse Midwife

## 2016-11-25 ENCOUNTER — Other Ambulatory Visit: Payer: Self-pay | Admitting: Certified Nurse Midwife

## 2016-11-25 DIAGNOSIS — J3089 Other allergic rhinitis: Secondary | ICD-10-CM | POA: Diagnosis not present

## 2016-11-25 DIAGNOSIS — J301 Allergic rhinitis due to pollen: Secondary | ICD-10-CM | POA: Diagnosis not present

## 2016-11-25 DIAGNOSIS — J3081 Allergic rhinitis due to animal (cat) (dog) hair and dander: Secondary | ICD-10-CM | POA: Diagnosis not present

## 2016-11-26 ENCOUNTER — Other Ambulatory Visit: Payer: Self-pay | Admitting: Certified Nurse Midwife

## 2016-11-27 ENCOUNTER — Other Ambulatory Visit: Payer: Self-pay | Admitting: Certified Nurse Midwife

## 2016-11-27 DIAGNOSIS — R6882 Decreased libido: Secondary | ICD-10-CM

## 2016-11-27 MED ORDER — NONFORMULARY OR COMPOUNDED ITEM: 0 refills | Status: DC

## 2016-11-30 DIAGNOSIS — J3089 Other allergic rhinitis: Secondary | ICD-10-CM | POA: Diagnosis not present

## 2016-11-30 DIAGNOSIS — J301 Allergic rhinitis due to pollen: Secondary | ICD-10-CM | POA: Diagnosis not present

## 2016-11-30 DIAGNOSIS — J3081 Allergic rhinitis due to animal (cat) (dog) hair and dander: Secondary | ICD-10-CM | POA: Diagnosis not present

## 2016-12-07 DIAGNOSIS — J301 Allergic rhinitis due to pollen: Secondary | ICD-10-CM | POA: Diagnosis not present

## 2016-12-07 DIAGNOSIS — J3081 Allergic rhinitis due to animal (cat) (dog) hair and dander: Secondary | ICD-10-CM | POA: Diagnosis not present

## 2016-12-07 DIAGNOSIS — J3089 Other allergic rhinitis: Secondary | ICD-10-CM | POA: Diagnosis not present

## 2016-12-17 DIAGNOSIS — J3081 Allergic rhinitis due to animal (cat) (dog) hair and dander: Secondary | ICD-10-CM | POA: Diagnosis not present

## 2016-12-17 DIAGNOSIS — J3089 Other allergic rhinitis: Secondary | ICD-10-CM | POA: Diagnosis not present

## 2016-12-17 DIAGNOSIS — J301 Allergic rhinitis due to pollen: Secondary | ICD-10-CM | POA: Diagnosis not present

## 2016-12-21 DIAGNOSIS — J3089 Other allergic rhinitis: Secondary | ICD-10-CM | POA: Diagnosis not present

## 2016-12-23 DIAGNOSIS — J3081 Allergic rhinitis due to animal (cat) (dog) hair and dander: Secondary | ICD-10-CM | POA: Diagnosis not present

## 2016-12-23 DIAGNOSIS — J301 Allergic rhinitis due to pollen: Secondary | ICD-10-CM | POA: Diagnosis not present

## 2016-12-23 DIAGNOSIS — D1801 Hemangioma of skin and subcutaneous tissue: Secondary | ICD-10-CM | POA: Diagnosis not present

## 2016-12-23 DIAGNOSIS — L821 Other seborrheic keratosis: Secondary | ICD-10-CM | POA: Diagnosis not present

## 2016-12-23 DIAGNOSIS — J3089 Other allergic rhinitis: Secondary | ICD-10-CM | POA: Diagnosis not present

## 2016-12-23 DIAGNOSIS — D239 Other benign neoplasm of skin, unspecified: Secondary | ICD-10-CM | POA: Diagnosis not present

## 2016-12-31 DIAGNOSIS — J3089 Other allergic rhinitis: Secondary | ICD-10-CM | POA: Diagnosis not present

## 2016-12-31 DIAGNOSIS — J3081 Allergic rhinitis due to animal (cat) (dog) hair and dander: Secondary | ICD-10-CM | POA: Diagnosis not present

## 2016-12-31 DIAGNOSIS — J301 Allergic rhinitis due to pollen: Secondary | ICD-10-CM | POA: Diagnosis not present

## 2017-01-04 DIAGNOSIS — J3081 Allergic rhinitis due to animal (cat) (dog) hair and dander: Secondary | ICD-10-CM | POA: Diagnosis not present

## 2017-01-04 DIAGNOSIS — J3089 Other allergic rhinitis: Secondary | ICD-10-CM | POA: Diagnosis not present

## 2017-01-04 DIAGNOSIS — J301 Allergic rhinitis due to pollen: Secondary | ICD-10-CM | POA: Diagnosis not present

## 2017-01-11 DIAGNOSIS — J301 Allergic rhinitis due to pollen: Secondary | ICD-10-CM | POA: Diagnosis not present

## 2017-01-11 DIAGNOSIS — J3089 Other allergic rhinitis: Secondary | ICD-10-CM | POA: Diagnosis not present

## 2017-01-11 DIAGNOSIS — J3081 Allergic rhinitis due to animal (cat) (dog) hair and dander: Secondary | ICD-10-CM | POA: Diagnosis not present

## 2017-01-18 DIAGNOSIS — J3081 Allergic rhinitis due to animal (cat) (dog) hair and dander: Secondary | ICD-10-CM | POA: Diagnosis not present

## 2017-01-18 DIAGNOSIS — J301 Allergic rhinitis due to pollen: Secondary | ICD-10-CM | POA: Diagnosis not present

## 2017-01-18 DIAGNOSIS — J3089 Other allergic rhinitis: Secondary | ICD-10-CM | POA: Diagnosis not present

## 2017-01-25 ENCOUNTER — Other Ambulatory Visit (HOSPITAL_COMMUNITY): Payer: Self-pay | Admitting: Family Medicine

## 2017-01-25 ENCOUNTER — Ambulatory Visit (HOSPITAL_COMMUNITY)
Admission: RE | Admit: 2017-01-25 | Discharge: 2017-01-25 | Disposition: A | Discharge status: Home / Self Care | Payer: 59 | Source: Ambulatory Visit | Attending: Family Medicine | Admitting: Family Medicine

## 2017-01-25 DIAGNOSIS — M25552 Pain in left hip: Secondary | ICD-10-CM | POA: Diagnosis not present

## 2017-01-25 DIAGNOSIS — M25551 Pain in right hip: Secondary | ICD-10-CM | POA: Diagnosis not present

## 2017-01-25 DIAGNOSIS — M25562 Pain in left knee: Secondary | ICD-10-CM | POA: Diagnosis not present

## 2017-01-26 DIAGNOSIS — J3089 Other allergic rhinitis: Secondary | ICD-10-CM | POA: Diagnosis not present

## 2017-01-26 DIAGNOSIS — J3081 Allergic rhinitis due to animal (cat) (dog) hair and dander: Secondary | ICD-10-CM | POA: Diagnosis not present

## 2017-01-26 DIAGNOSIS — J301 Allergic rhinitis due to pollen: Secondary | ICD-10-CM | POA: Diagnosis not present

## 2017-02-01 DIAGNOSIS — J301 Allergic rhinitis due to pollen: Secondary | ICD-10-CM | POA: Diagnosis not present

## 2017-02-01 DIAGNOSIS — J3089 Other allergic rhinitis: Secondary | ICD-10-CM | POA: Diagnosis not present

## 2017-02-01 DIAGNOSIS — J3081 Allergic rhinitis due to animal (cat) (dog) hair and dander: Secondary | ICD-10-CM | POA: Diagnosis not present

## 2017-02-08 DIAGNOSIS — J3081 Allergic rhinitis due to animal (cat) (dog) hair and dander: Secondary | ICD-10-CM | POA: Diagnosis not present

## 2017-02-08 DIAGNOSIS — J301 Allergic rhinitis due to pollen: Secondary | ICD-10-CM | POA: Diagnosis not present

## 2017-02-08 DIAGNOSIS — J3089 Other allergic rhinitis: Secondary | ICD-10-CM | POA: Diagnosis not present

## 2017-02-15 DIAGNOSIS — J301 Allergic rhinitis due to pollen: Secondary | ICD-10-CM | POA: Diagnosis not present

## 2017-02-15 DIAGNOSIS — J3089 Other allergic rhinitis: Secondary | ICD-10-CM | POA: Diagnosis not present

## 2017-02-15 DIAGNOSIS — J3081 Allergic rhinitis due to animal (cat) (dog) hair and dander: Secondary | ICD-10-CM | POA: Diagnosis not present

## 2017-02-23 DIAGNOSIS — J329 Chronic sinusitis, unspecified: Secondary | ICD-10-CM | POA: Diagnosis not present

## 2017-02-23 DIAGNOSIS — R05 Cough: Secondary | ICD-10-CM | POA: Diagnosis not present

## 2017-02-23 DIAGNOSIS — R070 Pain in throat: Secondary | ICD-10-CM | POA: Diagnosis not present

## 2017-03-07 DIAGNOSIS — J3081 Allergic rhinitis due to animal (cat) (dog) hair and dander: Secondary | ICD-10-CM | POA: Diagnosis not present

## 2017-03-07 DIAGNOSIS — J3089 Other allergic rhinitis: Secondary | ICD-10-CM | POA: Diagnosis not present

## 2017-03-07 DIAGNOSIS — J301 Allergic rhinitis due to pollen: Secondary | ICD-10-CM | POA: Diagnosis not present

## 2017-03-15 DIAGNOSIS — J301 Allergic rhinitis due to pollen: Secondary | ICD-10-CM | POA: Diagnosis not present

## 2017-03-15 DIAGNOSIS — J3089 Other allergic rhinitis: Secondary | ICD-10-CM | POA: Diagnosis not present

## 2017-03-15 DIAGNOSIS — J3081 Allergic rhinitis due to animal (cat) (dog) hair and dander: Secondary | ICD-10-CM | POA: Diagnosis not present

## 2017-03-16 DIAGNOSIS — M5136 Other intervertebral disc degeneration, lumbar region: Secondary | ICD-10-CM | POA: Diagnosis not present

## 2017-03-22 DIAGNOSIS — J3089 Other allergic rhinitis: Secondary | ICD-10-CM | POA: Diagnosis not present

## 2017-03-22 DIAGNOSIS — J3081 Allergic rhinitis due to animal (cat) (dog) hair and dander: Secondary | ICD-10-CM | POA: Diagnosis not present

## 2017-03-22 DIAGNOSIS — J301 Allergic rhinitis due to pollen: Secondary | ICD-10-CM | POA: Diagnosis not present

## 2017-03-29 DIAGNOSIS — J3089 Other allergic rhinitis: Secondary | ICD-10-CM | POA: Diagnosis not present

## 2017-03-29 DIAGNOSIS — J301 Allergic rhinitis due to pollen: Secondary | ICD-10-CM | POA: Diagnosis not present

## 2017-03-29 DIAGNOSIS — J3081 Allergic rhinitis due to animal (cat) (dog) hair and dander: Secondary | ICD-10-CM | POA: Diagnosis not present

## 2017-04-05 DIAGNOSIS — J3089 Other allergic rhinitis: Secondary | ICD-10-CM | POA: Diagnosis not present

## 2017-04-05 DIAGNOSIS — J301 Allergic rhinitis due to pollen: Secondary | ICD-10-CM | POA: Diagnosis not present

## 2017-04-05 DIAGNOSIS — J3081 Allergic rhinitis due to animal (cat) (dog) hair and dander: Secondary | ICD-10-CM | POA: Diagnosis not present

## 2017-04-07 DIAGNOSIS — M545 Low back pain: Secondary | ICD-10-CM | POA: Diagnosis not present

## 2017-04-07 DIAGNOSIS — M5136 Other intervertebral disc degeneration, lumbar region: Secondary | ICD-10-CM | POA: Diagnosis not present

## 2017-04-12 DIAGNOSIS — J301 Allergic rhinitis due to pollen: Secondary | ICD-10-CM | POA: Diagnosis not present

## 2017-04-12 DIAGNOSIS — J3081 Allergic rhinitis due to animal (cat) (dog) hair and dander: Secondary | ICD-10-CM | POA: Diagnosis not present

## 2017-04-12 DIAGNOSIS — J3089 Other allergic rhinitis: Secondary | ICD-10-CM | POA: Diagnosis not present

## 2017-04-19 DIAGNOSIS — J301 Allergic rhinitis due to pollen: Secondary | ICD-10-CM | POA: Diagnosis not present

## 2017-04-19 DIAGNOSIS — J3081 Allergic rhinitis due to animal (cat) (dog) hair and dander: Secondary | ICD-10-CM | POA: Diagnosis not present

## 2017-04-19 DIAGNOSIS — J3089 Other allergic rhinitis: Secondary | ICD-10-CM | POA: Diagnosis not present

## 2017-04-22 DIAGNOSIS — M5136 Other intervertebral disc degeneration, lumbar region: Secondary | ICD-10-CM | POA: Diagnosis not present

## 2017-04-26 DIAGNOSIS — J3089 Other allergic rhinitis: Secondary | ICD-10-CM | POA: Diagnosis not present

## 2017-04-26 DIAGNOSIS — J301 Allergic rhinitis due to pollen: Secondary | ICD-10-CM | POA: Diagnosis not present

## 2017-04-26 DIAGNOSIS — J3081 Allergic rhinitis due to animal (cat) (dog) hair and dander: Secondary | ICD-10-CM | POA: Diagnosis not present

## 2017-05-04 DIAGNOSIS — J3081 Allergic rhinitis due to animal (cat) (dog) hair and dander: Secondary | ICD-10-CM | POA: Diagnosis not present

## 2017-05-04 DIAGNOSIS — J3089 Other allergic rhinitis: Secondary | ICD-10-CM | POA: Diagnosis not present

## 2017-05-04 DIAGNOSIS — J301 Allergic rhinitis due to pollen: Secondary | ICD-10-CM | POA: Diagnosis not present

## 2017-05-18 DIAGNOSIS — J301 Allergic rhinitis due to pollen: Secondary | ICD-10-CM | POA: Diagnosis not present

## 2017-05-18 DIAGNOSIS — J3089 Other allergic rhinitis: Secondary | ICD-10-CM | POA: Diagnosis not present

## 2017-05-18 DIAGNOSIS — J3081 Allergic rhinitis due to animal (cat) (dog) hair and dander: Secondary | ICD-10-CM | POA: Diagnosis not present

## 2017-05-26 DIAGNOSIS — N951 Menopausal and female climacteric states: Secondary | ICD-10-CM | POA: Diagnosis not present

## 2017-05-31 DIAGNOSIS — J301 Allergic rhinitis due to pollen: Secondary | ICD-10-CM | POA: Diagnosis not present

## 2017-05-31 DIAGNOSIS — J3081 Allergic rhinitis due to animal (cat) (dog) hair and dander: Secondary | ICD-10-CM | POA: Diagnosis not present

## 2017-05-31 DIAGNOSIS — G479 Sleep disorder, unspecified: Secondary | ICD-10-CM | POA: Diagnosis not present

## 2017-05-31 DIAGNOSIS — R232 Flushing: Secondary | ICD-10-CM | POA: Diagnosis not present

## 2017-05-31 DIAGNOSIS — J3089 Other allergic rhinitis: Secondary | ICD-10-CM | POA: Diagnosis not present

## 2017-05-31 DIAGNOSIS — N951 Menopausal and female climacteric states: Secondary | ICD-10-CM | POA: Diagnosis not present

## 2017-06-07 DIAGNOSIS — J3081 Allergic rhinitis due to animal (cat) (dog) hair and dander: Secondary | ICD-10-CM | POA: Diagnosis not present

## 2017-06-07 DIAGNOSIS — J3089 Other allergic rhinitis: Secondary | ICD-10-CM | POA: Diagnosis not present

## 2017-06-07 DIAGNOSIS — J301 Allergic rhinitis due to pollen: Secondary | ICD-10-CM | POA: Diagnosis not present

## 2017-06-16 DIAGNOSIS — H6993 Unspecified Eustachian tube disorder, bilateral: Secondary | ICD-10-CM | POA: Diagnosis not present

## 2017-06-16 DIAGNOSIS — J019 Acute sinusitis, unspecified: Secondary | ICD-10-CM | POA: Diagnosis not present

## 2017-06-20 DIAGNOSIS — B349 Viral infection, unspecified: Secondary | ICD-10-CM | POA: Diagnosis not present

## 2017-06-28 DIAGNOSIS — R5383 Other fatigue: Secondary | ICD-10-CM | POA: Diagnosis not present

## 2017-06-28 DIAGNOSIS — R232 Flushing: Secondary | ICD-10-CM | POA: Diagnosis not present

## 2017-06-28 DIAGNOSIS — G479 Sleep disorder, unspecified: Secondary | ICD-10-CM | POA: Diagnosis not present

## 2017-06-30 ENCOUNTER — Other Ambulatory Visit (HOSPITAL_COMMUNITY): Payer: Self-pay | Admitting: Family Medicine

## 2017-06-30 ENCOUNTER — Ambulatory Visit (HOSPITAL_COMMUNITY)
Admission: RE | Admit: 2017-06-30 | Discharge: 2017-06-30 | Disposition: A | Discharge status: Home / Self Care | Payer: 59 | Source: Ambulatory Visit | Attending: Family Medicine | Admitting: Family Medicine

## 2017-06-30 DIAGNOSIS — Z1389 Encounter for screening for other disorder: Secondary | ICD-10-CM | POA: Diagnosis not present

## 2017-06-30 DIAGNOSIS — Z719 Counseling, unspecified: Secondary | ICD-10-CM | POA: Diagnosis not present

## 2017-06-30 DIAGNOSIS — Z0001 Encounter for general adult medical examination with abnormal findings: Secondary | ICD-10-CM | POA: Diagnosis not present

## 2017-06-30 DIAGNOSIS — J069 Acute upper respiratory infection, unspecified: Secondary | ICD-10-CM

## 2017-06-30 DIAGNOSIS — R05 Cough: Secondary | ICD-10-CM | POA: Diagnosis not present

## 2017-06-30 DIAGNOSIS — J302 Other seasonal allergic rhinitis: Secondary | ICD-10-CM | POA: Diagnosis not present

## 2017-06-30 DIAGNOSIS — J329 Chronic sinusitis, unspecified: Secondary | ICD-10-CM | POA: Diagnosis not present

## 2017-07-04 DIAGNOSIS — R739 Hyperglycemia, unspecified: Secondary | ICD-10-CM | POA: Diagnosis not present

## 2017-07-05 DIAGNOSIS — R232 Flushing: Secondary | ICD-10-CM | POA: Diagnosis not present

## 2017-07-05 DIAGNOSIS — G479 Sleep disorder, unspecified: Secondary | ICD-10-CM | POA: Diagnosis not present

## 2017-07-15 DIAGNOSIS — J3081 Allergic rhinitis due to animal (cat) (dog) hair and dander: Secondary | ICD-10-CM | POA: Diagnosis not present

## 2017-07-15 DIAGNOSIS — J3089 Other allergic rhinitis: Secondary | ICD-10-CM | POA: Diagnosis not present

## 2017-07-15 DIAGNOSIS — J301 Allergic rhinitis due to pollen: Secondary | ICD-10-CM | POA: Diagnosis not present

## 2017-07-18 DIAGNOSIS — J301 Allergic rhinitis due to pollen: Secondary | ICD-10-CM | POA: Diagnosis not present

## 2017-07-18 DIAGNOSIS — J3081 Allergic rhinitis due to animal (cat) (dog) hair and dander: Secondary | ICD-10-CM | POA: Diagnosis not present

## 2017-07-18 DIAGNOSIS — J3089 Other allergic rhinitis: Secondary | ICD-10-CM | POA: Diagnosis not present

## 2017-07-27 DIAGNOSIS — J301 Allergic rhinitis due to pollen: Secondary | ICD-10-CM | POA: Diagnosis not present

## 2017-07-27 DIAGNOSIS — J3089 Other allergic rhinitis: Secondary | ICD-10-CM | POA: Diagnosis not present

## 2017-07-27 DIAGNOSIS — J3081 Allergic rhinitis due to animal (cat) (dog) hair and dander: Secondary | ICD-10-CM | POA: Diagnosis not present

## 2017-08-02 DIAGNOSIS — J301 Allergic rhinitis due to pollen: Secondary | ICD-10-CM | POA: Diagnosis not present

## 2017-08-02 DIAGNOSIS — J3081 Allergic rhinitis due to animal (cat) (dog) hair and dander: Secondary | ICD-10-CM | POA: Diagnosis not present

## 2017-08-02 DIAGNOSIS — J3089 Other allergic rhinitis: Secondary | ICD-10-CM | POA: Diagnosis not present

## 2017-08-04 DIAGNOSIS — B9689 Other specified bacterial agents as the cause of diseases classified elsewhere: Secondary | ICD-10-CM | POA: Diagnosis not present

## 2017-08-04 DIAGNOSIS — Z6825 Body mass index (BMI) 25.0-25.9, adult: Secondary | ICD-10-CM | POA: Diagnosis not present

## 2017-08-04 DIAGNOSIS — N760 Acute vaginitis: Secondary | ICD-10-CM | POA: Diagnosis not present

## 2017-08-04 DIAGNOSIS — E663 Overweight: Secondary | ICD-10-CM | POA: Diagnosis not present

## 2017-08-09 DIAGNOSIS — J301 Allergic rhinitis due to pollen: Secondary | ICD-10-CM | POA: Diagnosis not present

## 2017-08-09 DIAGNOSIS — J3081 Allergic rhinitis due to animal (cat) (dog) hair and dander: Secondary | ICD-10-CM | POA: Diagnosis not present

## 2017-08-09 DIAGNOSIS — J3089 Other allergic rhinitis: Secondary | ICD-10-CM | POA: Diagnosis not present

## 2017-08-15 DIAGNOSIS — J3081 Allergic rhinitis due to animal (cat) (dog) hair and dander: Secondary | ICD-10-CM | POA: Diagnosis not present

## 2017-08-15 DIAGNOSIS — J301 Allergic rhinitis due to pollen: Secondary | ICD-10-CM | POA: Diagnosis not present

## 2017-08-15 DIAGNOSIS — J3089 Other allergic rhinitis: Secondary | ICD-10-CM | POA: Diagnosis not present

## 2017-08-17 DIAGNOSIS — A09 Infectious gastroenteritis and colitis, unspecified: Secondary | ICD-10-CM | POA: Diagnosis not present

## 2017-08-17 DIAGNOSIS — B349 Viral infection, unspecified: Secondary | ICD-10-CM | POA: Diagnosis not present

## 2017-08-23 DIAGNOSIS — J3089 Other allergic rhinitis: Secondary | ICD-10-CM | POA: Diagnosis not present

## 2017-08-23 DIAGNOSIS — J301 Allergic rhinitis due to pollen: Secondary | ICD-10-CM | POA: Diagnosis not present

## 2017-08-23 DIAGNOSIS — J3081 Allergic rhinitis due to animal (cat) (dog) hair and dander: Secondary | ICD-10-CM | POA: Diagnosis not present

## 2017-09-01 DIAGNOSIS — J3089 Other allergic rhinitis: Secondary | ICD-10-CM | POA: Diagnosis not present

## 2017-09-06 DIAGNOSIS — J3089 Other allergic rhinitis: Secondary | ICD-10-CM | POA: Diagnosis not present

## 2017-09-06 DIAGNOSIS — J3081 Allergic rhinitis due to animal (cat) (dog) hair and dander: Secondary | ICD-10-CM | POA: Diagnosis not present

## 2017-09-06 DIAGNOSIS — J301 Allergic rhinitis due to pollen: Secondary | ICD-10-CM | POA: Diagnosis not present

## 2017-09-09 DIAGNOSIS — N952 Postmenopausal atrophic vaginitis: Secondary | ICD-10-CM | POA: Diagnosis not present

## 2017-09-09 DIAGNOSIS — E663 Overweight: Secondary | ICD-10-CM | POA: Diagnosis not present

## 2017-09-09 DIAGNOSIS — B373 Candidiasis of vulva and vagina: Secondary | ICD-10-CM | POA: Diagnosis not present

## 2017-09-12 DIAGNOSIS — J3089 Other allergic rhinitis: Secondary | ICD-10-CM | POA: Diagnosis not present

## 2017-09-12 DIAGNOSIS — J3081 Allergic rhinitis due to animal (cat) (dog) hair and dander: Secondary | ICD-10-CM | POA: Diagnosis not present

## 2017-09-12 DIAGNOSIS — J301 Allergic rhinitis due to pollen: Secondary | ICD-10-CM | POA: Diagnosis not present

## 2017-09-27 DIAGNOSIS — J3089 Other allergic rhinitis: Secondary | ICD-10-CM | POA: Diagnosis not present

## 2017-09-27 DIAGNOSIS — G479 Sleep disorder, unspecified: Secondary | ICD-10-CM | POA: Diagnosis not present

## 2017-09-27 DIAGNOSIS — R232 Flushing: Secondary | ICD-10-CM | POA: Diagnosis not present

## 2017-09-27 DIAGNOSIS — J3081 Allergic rhinitis due to animal (cat) (dog) hair and dander: Secondary | ICD-10-CM | POA: Diagnosis not present

## 2017-09-27 DIAGNOSIS — J301 Allergic rhinitis due to pollen: Secondary | ICD-10-CM | POA: Diagnosis not present

## 2017-10-04 DIAGNOSIS — J301 Allergic rhinitis due to pollen: Secondary | ICD-10-CM | POA: Diagnosis not present

## 2017-10-04 DIAGNOSIS — G479 Sleep disorder, unspecified: Secondary | ICD-10-CM | POA: Diagnosis not present

## 2017-10-04 DIAGNOSIS — R5383 Other fatigue: Secondary | ICD-10-CM | POA: Diagnosis not present

## 2017-10-04 DIAGNOSIS — N951 Menopausal and female climacteric states: Secondary | ICD-10-CM | POA: Diagnosis not present

## 2017-10-04 DIAGNOSIS — J3089 Other allergic rhinitis: Secondary | ICD-10-CM | POA: Diagnosis not present

## 2017-10-04 DIAGNOSIS — J3081 Allergic rhinitis due to animal (cat) (dog) hair and dander: Secondary | ICD-10-CM | POA: Diagnosis not present

## 2017-10-05 DIAGNOSIS — J3081 Allergic rhinitis due to animal (cat) (dog) hair and dander: Secondary | ICD-10-CM | POA: Diagnosis not present

## 2017-10-05 DIAGNOSIS — J301 Allergic rhinitis due to pollen: Secondary | ICD-10-CM | POA: Diagnosis not present

## 2017-10-07 ENCOUNTER — Telehealth: Payer: Self-pay | Admitting: Certified Nurse Midwife

## 2017-10-07 NOTE — Telephone Encounter (Signed)
Left message on voicemail to call and reschedule cancelled appointment. °

## 2017-10-10 ENCOUNTER — Other Ambulatory Visit: Payer: Self-pay | Admitting: Certified Nurse Midwife

## 2017-10-10 DIAGNOSIS — Z1231 Encounter for screening mammogram for malignant neoplasm of breast: Secondary | ICD-10-CM

## 2017-10-11 DIAGNOSIS — J3089 Other allergic rhinitis: Secondary | ICD-10-CM | POA: Diagnosis not present

## 2017-10-11 DIAGNOSIS — J3081 Allergic rhinitis due to animal (cat) (dog) hair and dander: Secondary | ICD-10-CM | POA: Diagnosis not present

## 2017-10-11 DIAGNOSIS — J301 Allergic rhinitis due to pollen: Secondary | ICD-10-CM | POA: Diagnosis not present

## 2017-10-18 DIAGNOSIS — J3089 Other allergic rhinitis: Secondary | ICD-10-CM | POA: Diagnosis not present

## 2017-10-18 DIAGNOSIS — J3081 Allergic rhinitis due to animal (cat) (dog) hair and dander: Secondary | ICD-10-CM | POA: Diagnosis not present

## 2017-10-18 DIAGNOSIS — J301 Allergic rhinitis due to pollen: Secondary | ICD-10-CM | POA: Diagnosis not present

## 2017-10-25 DIAGNOSIS — J3089 Other allergic rhinitis: Secondary | ICD-10-CM | POA: Diagnosis not present

## 2017-10-25 DIAGNOSIS — J301 Allergic rhinitis due to pollen: Secondary | ICD-10-CM | POA: Diagnosis not present

## 2017-10-25 DIAGNOSIS — J3081 Allergic rhinitis due to animal (cat) (dog) hair and dander: Secondary | ICD-10-CM | POA: Diagnosis not present

## 2017-11-01 DIAGNOSIS — R5383 Other fatigue: Secondary | ICD-10-CM | POA: Diagnosis not present

## 2017-11-01 DIAGNOSIS — J3089 Other allergic rhinitis: Secondary | ICD-10-CM | POA: Diagnosis not present

## 2017-11-01 DIAGNOSIS — J301 Allergic rhinitis due to pollen: Secondary | ICD-10-CM | POA: Diagnosis not present

## 2017-11-01 DIAGNOSIS — N951 Menopausal and female climacteric states: Secondary | ICD-10-CM | POA: Diagnosis not present

## 2017-11-07 DIAGNOSIS — J301 Allergic rhinitis due to pollen: Secondary | ICD-10-CM | POA: Diagnosis not present

## 2017-11-07 DIAGNOSIS — J3081 Allergic rhinitis due to animal (cat) (dog) hair and dander: Secondary | ICD-10-CM | POA: Diagnosis not present

## 2017-11-07 DIAGNOSIS — J3089 Other allergic rhinitis: Secondary | ICD-10-CM | POA: Diagnosis not present

## 2017-11-15 DIAGNOSIS — J301 Allergic rhinitis due to pollen: Secondary | ICD-10-CM | POA: Diagnosis not present

## 2017-11-15 DIAGNOSIS — J3081 Allergic rhinitis due to animal (cat) (dog) hair and dander: Secondary | ICD-10-CM | POA: Diagnosis not present

## 2017-11-15 DIAGNOSIS — J3089 Other allergic rhinitis: Secondary | ICD-10-CM | POA: Diagnosis not present

## 2017-11-21 ENCOUNTER — Encounter (HOSPITAL_COMMUNITY): Payer: Self-pay

## 2017-11-21 ENCOUNTER — Ambulatory Visit (HOSPITAL_COMMUNITY)
Admission: RE | Admit: 2017-11-21 | Discharge: 2017-11-21 | Disposition: A | Discharge status: Home / Self Care | Payer: 59 | Source: Ambulatory Visit | Attending: Certified Nurse Midwife | Admitting: Certified Nurse Midwife

## 2017-11-21 DIAGNOSIS — Z1231 Encounter for screening mammogram for malignant neoplasm of breast: Secondary | ICD-10-CM | POA: Insufficient documentation

## 2017-11-22 ENCOUNTER — Ambulatory Visit: Payer: 59 | Admitting: Certified Nurse Midwife

## 2017-11-22 DIAGNOSIS — J301 Allergic rhinitis due to pollen: Secondary | ICD-10-CM | POA: Diagnosis not present

## 2017-11-22 DIAGNOSIS — J3089 Other allergic rhinitis: Secondary | ICD-10-CM | POA: Diagnosis not present

## 2017-11-22 DIAGNOSIS — J3081 Allergic rhinitis due to animal (cat) (dog) hair and dander: Secondary | ICD-10-CM | POA: Diagnosis not present

## 2017-12-01 DIAGNOSIS — J301 Allergic rhinitis due to pollen: Secondary | ICD-10-CM | POA: Diagnosis not present

## 2017-12-01 DIAGNOSIS — J3089 Other allergic rhinitis: Secondary | ICD-10-CM | POA: Diagnosis not present

## 2017-12-01 DIAGNOSIS — J3081 Allergic rhinitis due to animal (cat) (dog) hair and dander: Secondary | ICD-10-CM | POA: Diagnosis not present

## 2017-12-06 DIAGNOSIS — J3089 Other allergic rhinitis: Secondary | ICD-10-CM | POA: Diagnosis not present

## 2017-12-06 DIAGNOSIS — J3081 Allergic rhinitis due to animal (cat) (dog) hair and dander: Secondary | ICD-10-CM | POA: Diagnosis not present

## 2017-12-06 DIAGNOSIS — J301 Allergic rhinitis due to pollen: Secondary | ICD-10-CM | POA: Diagnosis not present

## 2017-12-15 ENCOUNTER — Other Ambulatory Visit: Payer: Self-pay

## 2017-12-15 ENCOUNTER — Ambulatory Visit: Payer: 59 | Admitting: Certified Nurse Midwife

## 2017-12-15 ENCOUNTER — Other Ambulatory Visit (HOSPITAL_COMMUNITY)
Admission: RE | Admit: 2017-12-15 | Discharge: 2017-12-15 | Disposition: A | Discharge status: Home / Self Care | Payer: 59 | Source: Ambulatory Visit | Attending: Certified Nurse Midwife | Admitting: Certified Nurse Midwife

## 2017-12-15 ENCOUNTER — Encounter: Payer: Self-pay | Admitting: Certified Nurse Midwife

## 2017-12-15 VITALS — BP 106/64 | HR 68 | Resp 16 | Ht 65.25 in | Wt 152.0 lb

## 2017-12-15 DIAGNOSIS — Z79890 Hormone replacement therapy (postmenopausal): Secondary | ICD-10-CM

## 2017-12-15 DIAGNOSIS — E2839 Other primary ovarian failure: Secondary | ICD-10-CM

## 2017-12-15 DIAGNOSIS — Z124 Encounter for screening for malignant neoplasm of cervix: Secondary | ICD-10-CM

## 2017-12-15 DIAGNOSIS — Z01419 Encounter for gynecological examination (general) (routine) without abnormal findings: Secondary | ICD-10-CM

## 2017-12-15 DIAGNOSIS — Z659 Problem related to unspecified psychosocial circumstances: Secondary | ICD-10-CM

## 2017-12-15 DIAGNOSIS — N951 Menopausal and female climacteric states: Secondary | ICD-10-CM

## 2017-12-15 DIAGNOSIS — Z1151 Encounter for screening for human papillomavirus (HPV): Secondary | ICD-10-CM | POA: Diagnosis not present

## 2017-12-15 NOTE — Progress Notes (Signed)
62 y.o. G0P0000 Widowed  Caucasian Fe here for annual exam. Menopausal no HRT. Denies vaginal bleeding or vaginal dryness. Social stress with partner's sudden death from flu complications. Patient feels she has no closure with his death, but has friends and family support Still caring for parents. Patient struggling, but now in counseling. Seeing [facility] for HRT with oral progesterone and combination pellet injection every 6 months. Feel so much better since starting therapy.  Labs drawn at each visit. Last ones per patient were normal hormone levels. Sees Dr. Hilma Favors for aex, labs and medication management when needed. No HSV outbreaks, no Rx desired.. No other health issues today.   Patient's last menstrual period was 05/24/2005.          Sexually active: No.  The current method of family planning is tubal ligation.    Exercising: Yes.    walking, golfing, dancing Smoker:  no  Health Maintenance: Pap:  07-06-15 ASCUS HPV HR-,11-11-15 neg HPV HR neg History of Abnormal Pap: yes MMG:  11-21-17 category b density birads 1:neg Self Breast exams: yes Colonoscopy:  2012 per patient BMD:   2010 TDaP:  2010 Shingles: not done Pneumonia: not done Hep C and HIV: both neg 2016 Labs: declines   reports that she has quit smoking. She has never used smokeless tobacco. She reports that she drinks about 9.0 oz of alcohol per week. She reports that she does not use drugs.  Past Medical History:  Diagnosis Date  . Abdominal pain, epigastric   . Abnormal Pap smear of cervix    11-01-14 LGSIL  . Arthritis    in back  . Asthma    as a child  . Constipation   . History of Salmonella infection   . HSV infection   . Irritable bowel syndrome   . Melanoma of skin, site unspecified   . Rotator cuff syndrome of left shoulder   . Seasonal allergies   . Shoulder pain     Past Surgical History:  Procedure Laterality Date  . COLPOSCOPY  2016  . DILATION AND CURETTAGE OF UTERUS  1997   bleeding  .  MOLE REMOVAL     cancer  . SKIN GRAFT    . Thermal Ablation     of the uterus  . TUBAL LIGATION      Current Outpatient Medications  Medication Sig Dispense Refill  . Cyanocobalamin (VITAMIN B-12) 2500 MCG SUBL Place under the tongue daily.     . Estradiol (VAGIFEM) 10 MCG TABS vaginal tablet Use  One tablet,2 times weekly per vagina 8 tablet 12  . montelukast (SINGULAIR) 10 MG tablet as needed.     . NONFORMULARY OR COMPOUNDED ITEM Testosterone Propionate Petrolatum(jar) One tear drop size amount to lower vaginal opening external skin area only, at bedtime 2 times weekly 60 each 0  . UNABLE TO FIND Allergy shots    . valACYclovir (VALTREX) 500 MG tablet Take 1 tablet (500 mg total) by mouth daily. 30 tablet 2   No current facility-administered medications for this visit.     Family History  Problem Relation Age of Onset  . Leukemia Brother   . Hypertension Mother   . Heart attack Mother   . Fibroids Mother        h/x hysterectomy  . Hypothyroidism Mother   . Diabetes Mother   . Acromegaly Mother   . Hypertension Father   . Kidney Stones Father   . Diabetes Father   . Heart block  Father        stint put in  . Diabetes Maternal Grandmother   . Heart failure Maternal Grandmother   . Depression Maternal Grandfather   . Parkinson's disease Maternal Grandfather   . Cancer Maternal Grandfather        brain  . Breast cancer Paternal Aunt   . Breast cancer Paternal Aunt     ROS:  Pertinent items are noted in HPI.  Otherwise, a comprehensive ROS was negative.  Exam:   LMP 05/24/2005    Ht Readings from Last 3 Encounters:  11/19/16 5' 5.25" (1.657 m)  11/11/15 5' 5.5" (1.664 m)  06/26/15 5' 5.25" (1.657 m)    General appearance: alert, cooperative and appears stated age Head: Normocephalic, without obvious abnormality, atraumatic Neck: no adenopathy, supple, symmetrical, trachea midline and thyroid normal to inspection and palpation Lungs: clear to auscultation  bilaterally Breasts: normal appearance, no masses or tenderness, No nipple retraction or dimpling, No nipple discharge or bleeding, No axillary or supraclavicular adenopathy Heart: regular rate and rhythm Abdomen: soft, non-tender; no masses,  no organomegaly Extremities: extremities normal, atraumatic, no cyanosis or edema Skin: Skin color, texture, turgor normal. No rashes or lesions Lymph nodes: Cervical, supraclavicular, and axillary nodes normal. No abnormal inguinal nodes palpated Neurologic: Grossly normal   Pelvic: External genitalia:  no lesions              Urethra:  normal appearing urethra with no masses, tenderness or lesions              Bartholin's and Skene's: normal                 Vagina: normal appearing vagina with normal color and discharge, no lesions              Cervix: no bleeding following Pap, no cervical motion tenderness and no lesions              Pap taken: Yes.   Bimanual Exam:  Uterus:  normal size, contour, position, consistency, mobility, non-tender and anteverted              Adnexa: normal adnexa and no mass, fullness, tenderness               Rectovaginal: Confirms               Anus:  normal sphincter tone, no lesions  Chaperone present: yes  A:  Well Woman with normal exam  Menopausal on HRT with [facility] management  BMD due  Social stress with death of partner  P:   Reviewed health and wellness pertinent to exam  Aware of need to advise if vaginal bleeding  Discussed BMD due for evaluation for osteoporosis screening. Will place order and they will call patient.  Continue counseling as needed and seek support from friends and family.  Pap smear: yes   counseled on breast self exam, mammography screening, STD prevention, HIV risk factors and prevention, feminine hygiene, osteoporosis, adequate intake of calcium and vitamin D, diet and exercise  return annually or prn  An After Visit Summary was printed and given to the patient.

## 2017-12-15 NOTE — Patient Instructions (Signed)

## 2017-12-20 DIAGNOSIS — J3089 Other allergic rhinitis: Secondary | ICD-10-CM | POA: Diagnosis not present

## 2017-12-20 DIAGNOSIS — J3081 Allergic rhinitis due to animal (cat) (dog) hair and dander: Secondary | ICD-10-CM | POA: Diagnosis not present

## 2017-12-20 DIAGNOSIS — J301 Allergic rhinitis due to pollen: Secondary | ICD-10-CM | POA: Diagnosis not present

## 2017-12-20 LAB — CYTOLOGY - PAP
DIAGNOSIS: NEGATIVE
HPV (WINDOPATH): NOT DETECTED

## 2017-12-27 DIAGNOSIS — J301 Allergic rhinitis due to pollen: Secondary | ICD-10-CM | POA: Diagnosis not present

## 2017-12-27 DIAGNOSIS — J3081 Allergic rhinitis due to animal (cat) (dog) hair and dander: Secondary | ICD-10-CM | POA: Diagnosis not present

## 2017-12-27 DIAGNOSIS — J3089 Other allergic rhinitis: Secondary | ICD-10-CM | POA: Diagnosis not present

## 2018-01-02 DIAGNOSIS — J301 Allergic rhinitis due to pollen: Secondary | ICD-10-CM | POA: Diagnosis not present

## 2018-01-02 DIAGNOSIS — J3081 Allergic rhinitis due to animal (cat) (dog) hair and dander: Secondary | ICD-10-CM | POA: Diagnosis not present

## 2018-01-02 DIAGNOSIS — J3089 Other allergic rhinitis: Secondary | ICD-10-CM | POA: Diagnosis not present

## 2018-01-03 DIAGNOSIS — L718 Other rosacea: Secondary | ICD-10-CM | POA: Diagnosis not present

## 2018-01-03 DIAGNOSIS — D225 Melanocytic nevi of trunk: Secondary | ICD-10-CM | POA: Diagnosis not present

## 2018-01-03 DIAGNOSIS — L570 Actinic keratosis: Secondary | ICD-10-CM | POA: Diagnosis not present

## 2018-01-03 DIAGNOSIS — K130 Diseases of lips: Secondary | ICD-10-CM | POA: Diagnosis not present

## 2018-01-09 DIAGNOSIS — M1611 Unilateral primary osteoarthritis, right hip: Secondary | ICD-10-CM | POA: Diagnosis not present

## 2018-01-09 DIAGNOSIS — M5442 Lumbago with sciatica, left side: Secondary | ICD-10-CM | POA: Diagnosis not present

## 2018-01-10 DIAGNOSIS — J301 Allergic rhinitis due to pollen: Secondary | ICD-10-CM | POA: Diagnosis not present

## 2018-01-10 DIAGNOSIS — J3089 Other allergic rhinitis: Secondary | ICD-10-CM | POA: Diagnosis not present

## 2018-01-10 DIAGNOSIS — J3081 Allergic rhinitis due to animal (cat) (dog) hair and dander: Secondary | ICD-10-CM | POA: Diagnosis not present

## 2018-01-12 DIAGNOSIS — M545 Low back pain: Secondary | ICD-10-CM | POA: Diagnosis not present

## 2018-01-12 DIAGNOSIS — M13851 Other specified arthritis, right hip: Secondary | ICD-10-CM | POA: Diagnosis not present

## 2018-01-16 DIAGNOSIS — M545 Low back pain: Secondary | ICD-10-CM | POA: Diagnosis not present

## 2018-01-16 DIAGNOSIS — M13851 Other specified arthritis, right hip: Secondary | ICD-10-CM | POA: Diagnosis not present

## 2018-01-17 DIAGNOSIS — J3081 Allergic rhinitis due to animal (cat) (dog) hair and dander: Secondary | ICD-10-CM | POA: Diagnosis not present

## 2018-01-17 DIAGNOSIS — J301 Allergic rhinitis due to pollen: Secondary | ICD-10-CM | POA: Diagnosis not present

## 2018-01-17 DIAGNOSIS — J3089 Other allergic rhinitis: Secondary | ICD-10-CM | POA: Diagnosis not present

## 2018-01-19 DIAGNOSIS — M13851 Other specified arthritis, right hip: Secondary | ICD-10-CM | POA: Diagnosis not present

## 2018-01-19 DIAGNOSIS — M545 Low back pain: Secondary | ICD-10-CM | POA: Diagnosis not present

## 2018-01-24 DIAGNOSIS — J3081 Allergic rhinitis due to animal (cat) (dog) hair and dander: Secondary | ICD-10-CM | POA: Diagnosis not present

## 2018-01-24 DIAGNOSIS — J3089 Other allergic rhinitis: Secondary | ICD-10-CM | POA: Diagnosis not present

## 2018-01-24 DIAGNOSIS — J301 Allergic rhinitis due to pollen: Secondary | ICD-10-CM | POA: Diagnosis not present

## 2018-01-25 DIAGNOSIS — M1611 Unilateral primary osteoarthritis, right hip: Secondary | ICD-10-CM | POA: Diagnosis not present

## 2018-01-25 DIAGNOSIS — M545 Low back pain: Secondary | ICD-10-CM | POA: Diagnosis not present

## 2018-01-25 DIAGNOSIS — M13851 Other specified arthritis, right hip: Secondary | ICD-10-CM | POA: Diagnosis not present

## 2018-01-27 DIAGNOSIS — M545 Low back pain: Secondary | ICD-10-CM | POA: Diagnosis not present

## 2018-01-27 DIAGNOSIS — M13851 Other specified arthritis, right hip: Secondary | ICD-10-CM | POA: Diagnosis not present

## 2018-01-30 ENCOUNTER — Other Ambulatory Visit: Payer: Self-pay | Admitting: Certified Nurse Midwife

## 2018-01-30 MED ORDER — VALACYCLOVIR HCL 500 MG PO TABS: 500.0000 mg | ORAL_TABLET | Freq: Every day | ORAL | 2 refills | Status: DC

## 2018-01-30 NOTE — Telephone Encounter (Signed)
Patient requesting a refill for valtrex. Pharmacy is [facility] in [place] at [phone]

## 2018-01-30 NOTE — Telephone Encounter (Signed)
Medication refill request: Valtrex  Last AEX:  12/15/17  Next AEX: 12/21/18 Last MMG (if hormonal medication request):  11/21/17 Bi- rads category 1 neg  Refill authorized: Please refill if appropriate.

## 2018-01-31 DIAGNOSIS — J3081 Allergic rhinitis due to animal (cat) (dog) hair and dander: Secondary | ICD-10-CM | POA: Diagnosis not present

## 2018-01-31 DIAGNOSIS — J301 Allergic rhinitis due to pollen: Secondary | ICD-10-CM | POA: Diagnosis not present

## 2018-01-31 DIAGNOSIS — J3089 Other allergic rhinitis: Secondary | ICD-10-CM | POA: Diagnosis not present

## 2018-02-08 DIAGNOSIS — J3089 Other allergic rhinitis: Secondary | ICD-10-CM | POA: Diagnosis not present

## 2018-02-13 DIAGNOSIS — M1611 Unilateral primary osteoarthritis, right hip: Secondary | ICD-10-CM | POA: Diagnosis not present

## 2018-02-13 DIAGNOSIS — M5442 Lumbago with sciatica, left side: Secondary | ICD-10-CM | POA: Diagnosis not present

## 2018-02-14 DIAGNOSIS — J3089 Other allergic rhinitis: Secondary | ICD-10-CM | POA: Diagnosis not present

## 2018-02-14 DIAGNOSIS — K130 Diseases of lips: Secondary | ICD-10-CM | POA: Diagnosis not present

## 2018-02-14 DIAGNOSIS — J301 Allergic rhinitis due to pollen: Secondary | ICD-10-CM | POA: Diagnosis not present

## 2018-02-14 DIAGNOSIS — L905 Scar conditions and fibrosis of skin: Secondary | ICD-10-CM | POA: Diagnosis not present

## 2018-02-14 DIAGNOSIS — J3081 Allergic rhinitis due to animal (cat) (dog) hair and dander: Secondary | ICD-10-CM | POA: Diagnosis not present

## 2018-02-21 DIAGNOSIS — N951 Menopausal and female climacteric states: Secondary | ICD-10-CM | POA: Diagnosis not present

## 2018-02-21 DIAGNOSIS — R5383 Other fatigue: Secondary | ICD-10-CM | POA: Diagnosis not present

## 2018-02-21 DIAGNOSIS — R6882 Decreased libido: Secondary | ICD-10-CM | POA: Diagnosis not present

## 2018-02-21 NOTE — Progress Notes (Deleted)
62 y.o. Widowed {Race/ethnicity:17218} female G0P0000 here with complaint of vaginal symptoms of itching, burning, and increase discharge. Describes discharge as ***. Onset of symptoms *** days ago. Denies new personal products or vaginal dryness. *** STD concerns. Urinary symptoms *** . Contraception is  ROS  O:Healthy female WDWN Affect: normal, orientation x 3  Exam: Abdomen: Lymph node: no enlargement or tenderness Pelvic exam: External genital: normal female BUS: negative Vagina: *** discharge noted. Ph:   ,Wet prep taken, Affirm taken Cervix: normal, non tender, no CMT Uterus: normal, non tender Adnexa:normal, non tender, no masses or fullness noted   Wet Prep results:   A:Normal pelvic exam   P:Discussed findings of *** and etiology. Discussed Aveeno or baking soda sitz bath for comfort. Avoid moist clothes or pads for extended period of time. If working out in gym clothes or swim suits for long periods of time change underwear or bottoms of swimsuit if possible. Coconut Oil use for skin protection prior to activity can be used to external skin for protection or dryness. Rx: ***  Rv prn

## 2018-02-22 ENCOUNTER — Encounter: Payer: Self-pay | Admitting: Certified Nurse Midwife

## 2018-02-22 ENCOUNTER — Other Ambulatory Visit: Payer: Self-pay

## 2018-02-22 ENCOUNTER — Ambulatory Visit: Payer: 59 | Admitting: Certified Nurse Midwife

## 2018-02-22 VITALS — BP 102/64 | HR 68 | Resp 16 | Wt 145.0 lb

## 2018-02-22 DIAGNOSIS — N898 Other specified noninflammatory disorders of vagina: Secondary | ICD-10-CM

## 2018-02-22 DIAGNOSIS — N950 Postmenopausal bleeding: Secondary | ICD-10-CM

## 2018-02-22 DIAGNOSIS — N939 Abnormal uterine and vaginal bleeding, unspecified: Secondary | ICD-10-CM | POA: Diagnosis not present

## 2018-02-22 NOTE — Progress Notes (Signed)
62 y.o. Widowed Caucasian female G0P0000 here with complaint of vaginal symptoms of itching, burning, and increase discharge. Was treated for URI with amoxicillin and vaginal symptoms started. Patient also had bleeding after 12 years of amenorrhea. Patient has been seeing [facility] and was due to restart progesterone in 6/19 and did not have due to normal levels and no refill called in. Patient reports  vaginal bleeding for 12 days.Called [facility] and had blood work drawn one day ago. Per patient no concerns about bleeding.. Patient treated with Diflucan called in from PCP. Started about 2 weeks ago and still not resolved. ? Bacterial infection. Describes discharge as not seeing any color now, but still irritating and questionable odor. No HSV outbreaks. Has two more doses of Diflucan if needed. No other concerns today.  Review of Systems  Constitutional: Negative.   Respiratory: Negative.   Cardiovascular: Negative.   Gastrointestinal: Positive for diarrhea.       Previous diarrhea has stopped  Genitourinary: Negative.        Vaginal itching, burning, rawness,unscheduled bleeding or spotting  Musculoskeletal: Negative.   Skin: Negative.        External vaginal skin irritated  Neurological: Negative.   Psychiatric/Behavioral: Negative.     O:Healthy female WDWN Affect: normal, orientation x 3  Exam:Skin: warm and dry Abdomen:soft, non tender  Inguinal Lymph nodes: no enlargement or tenderness Pelvic exam: External genital: normal female with areas of slight redness, but no exudate BUS: negative Vagina: white non odorous discharge noted. , Affirm taken Cervix: normal, non tender, no CMT, no blood present from cervix Uterus: normal, non tender Adnexa:normal, non tender, no masses or fullness noted.   A:Normal pelvic exam R/O vaginal infection Menopausal on HRT, unopposed estrogen ? For ? Length of time with management of HRT with [facility] Post menopausal bleeding.   P:Discussed  findings of vaginal discharge and will wait for affirm to treat due to on Diflucan. . Discussed Aveeno or baking soda sitz bath for comfort.   Coconut Oil  can be used to external skin for protection or dryness. Lab : Affirm Discussed concern with PMB vs withdrawal bleeding from HRT. Would recommend PUS and endometrial biopsy to make sure no unhealthy tissue caused the bleeding. Questions addressed. Patient came here to make sure she was taken care and agreeable. Will be called to schedule and with insurance information. Report if bleeding happens again.  Rv prn, as above

## 2018-02-23 ENCOUNTER — Other Ambulatory Visit: Payer: Self-pay

## 2018-02-23 DIAGNOSIS — J301 Allergic rhinitis due to pollen: Secondary | ICD-10-CM | POA: Diagnosis not present

## 2018-02-23 DIAGNOSIS — J3081 Allergic rhinitis due to animal (cat) (dog) hair and dander: Secondary | ICD-10-CM | POA: Diagnosis not present

## 2018-02-23 DIAGNOSIS — J3089 Other allergic rhinitis: Secondary | ICD-10-CM | POA: Diagnosis not present

## 2018-02-23 LAB — VAGINITIS/VAGINOSIS, DNA PROBE
Candida Species: POSITIVE — AB
Gardnerella vaginalis: POSITIVE — AB
TRICHOMONAS VAG: NEGATIVE

## 2018-02-23 MED ORDER — METRONIDAZOLE 0.75 % VA GEL: VAGINAL | 0 refills | Status: DC

## 2018-02-28 DIAGNOSIS — J3081 Allergic rhinitis due to animal (cat) (dog) hair and dander: Secondary | ICD-10-CM | POA: Diagnosis not present

## 2018-02-28 DIAGNOSIS — J3089 Other allergic rhinitis: Secondary | ICD-10-CM | POA: Diagnosis not present

## 2018-02-28 DIAGNOSIS — J301 Allergic rhinitis due to pollen: Secondary | ICD-10-CM | POA: Diagnosis not present

## 2018-03-01 DIAGNOSIS — J3081 Allergic rhinitis due to animal (cat) (dog) hair and dander: Secondary | ICD-10-CM | POA: Diagnosis not present

## 2018-03-01 DIAGNOSIS — J301 Allergic rhinitis due to pollen: Secondary | ICD-10-CM | POA: Diagnosis not present

## 2018-03-07 DIAGNOSIS — N951 Menopausal and female climacteric states: Secondary | ICD-10-CM | POA: Diagnosis not present

## 2018-03-07 DIAGNOSIS — G479 Sleep disorder, unspecified: Secondary | ICD-10-CM | POA: Diagnosis not present

## 2018-03-09 ENCOUNTER — Ambulatory Visit (INDEPENDENT_AMBULATORY_CARE_PROVIDER_SITE_OTHER): Payer: 59

## 2018-03-09 ENCOUNTER — Encounter: Payer: Self-pay | Admitting: Obstetrics & Gynecology

## 2018-03-09 ENCOUNTER — Other Ambulatory Visit: Payer: Self-pay | Admitting: *Deleted

## 2018-03-09 ENCOUNTER — Ambulatory Visit: Payer: 59 | Admitting: Obstetrics & Gynecology

## 2018-03-09 DIAGNOSIS — N950 Postmenopausal bleeding: Secondary | ICD-10-CM

## 2018-03-09 NOTE — Progress Notes (Signed)
62 y.o. G0P0000 Widowed White or Caucasian female here for pelvic ultrasound due to PMP bleeding that occurred this summer.  Pt is on HRT through [facility].  Had a difficult summer with aging parents and stopped her progesterone without really thinking about it.  She had bright red bleeding for about 12 days.  Bleeding has stopped.  She discussed this with Melvia Heaps, CNM, on 02/22/18 and ultrasound with biopsy was recommended.  She is receiving estrogen pellets and is back taking her oral progesterone daily at this point.  Importance of progesterone reviewed with pt.  Questions answered.  No LMP recorded. (Menstrual status: Other).  Contraception: PMP  Findings:  UTERUS: 7.2 x 4.1  2.9cm.  No fibroids noted. EMS: 2.1 - 2.32mm ADNEXA: Left ovary:  1.4 x 0.7 x 0.6cm       Right ovary: 1.7 x 0.7 x 0.8cm.  Ovaries are atrophic in appearance. CUL DE SAC: no free fluid noted  Discussion:  Findings reviewed.  Endometrial biopsy recommended.  Consent obtained.  Endometrial biopsy recommended.  Discussed with patient.  Verbal and written consent obtained.   Procedure:  Speculum placed.  Cervix visualized and cleansed with betadine prep.  A single toothed tenaculum was applied to the anterior lip of the cervix.  Endometrial pipelle was advanced through the cervix into the endometrial cavity without difficulty.  Pipelle passed to 6.5cm.  Suction applied and pipelle removed with scant tissue sample obtained.  Repeat biopsy was performed again with scan tissue sample obtained.  Pt has significant cramping with passage of endometrial pipelle both times.  Procedure ended.  Tenculum removed.  No bleeding noted.  Patient tolerated procedure well. . Assessment:  PMP bleeding in pt on HRT with [facility], who also forgot to take progesterone for about 2 months  Plan:  Endometrial biopsy pending.  Results will be called to pt.  Importance of Progesterone reviewed.  Pt voices clear understanding.  ~15 minutes  spent with patient >50% of time was in face to face discussion of above.

## 2018-03-14 DIAGNOSIS — J301 Allergic rhinitis due to pollen: Secondary | ICD-10-CM | POA: Diagnosis not present

## 2018-03-14 DIAGNOSIS — J3089 Other allergic rhinitis: Secondary | ICD-10-CM | POA: Diagnosis not present

## 2018-03-14 DIAGNOSIS — J3081 Allergic rhinitis due to animal (cat) (dog) hair and dander: Secondary | ICD-10-CM | POA: Diagnosis not present

## 2018-03-20 DIAGNOSIS — J3081 Allergic rhinitis due to animal (cat) (dog) hair and dander: Secondary | ICD-10-CM | POA: Diagnosis not present

## 2018-03-20 DIAGNOSIS — J301 Allergic rhinitis due to pollen: Secondary | ICD-10-CM | POA: Diagnosis not present

## 2018-03-20 DIAGNOSIS — J3089 Other allergic rhinitis: Secondary | ICD-10-CM | POA: Diagnosis not present

## 2018-03-21 DIAGNOSIS — N760 Acute vaginitis: Secondary | ICD-10-CM | POA: Diagnosis not present

## 2018-03-21 DIAGNOSIS — Z6824 Body mass index (BMI) 24.0-24.9, adult: Secondary | ICD-10-CM | POA: Diagnosis not present

## 2018-03-28 DIAGNOSIS — J3089 Other allergic rhinitis: Secondary | ICD-10-CM | POA: Diagnosis not present

## 2018-03-28 DIAGNOSIS — J3081 Allergic rhinitis due to animal (cat) (dog) hair and dander: Secondary | ICD-10-CM | POA: Diagnosis not present

## 2018-03-28 DIAGNOSIS — J301 Allergic rhinitis due to pollen: Secondary | ICD-10-CM | POA: Diagnosis not present

## 2018-04-05 DIAGNOSIS — J301 Allergic rhinitis due to pollen: Secondary | ICD-10-CM | POA: Diagnosis not present

## 2018-04-05 DIAGNOSIS — J3081 Allergic rhinitis due to animal (cat) (dog) hair and dander: Secondary | ICD-10-CM | POA: Diagnosis not present

## 2018-04-05 DIAGNOSIS — J3089 Other allergic rhinitis: Secondary | ICD-10-CM | POA: Diagnosis not present

## 2018-04-14 DIAGNOSIS — J301 Allergic rhinitis due to pollen: Secondary | ICD-10-CM | POA: Diagnosis not present

## 2018-04-14 DIAGNOSIS — J3081 Allergic rhinitis due to animal (cat) (dog) hair and dander: Secondary | ICD-10-CM | POA: Diagnosis not present

## 2018-04-14 DIAGNOSIS — J3089 Other allergic rhinitis: Secondary | ICD-10-CM | POA: Diagnosis not present

## 2018-04-18 DIAGNOSIS — J301 Allergic rhinitis due to pollen: Secondary | ICD-10-CM | POA: Diagnosis not present

## 2018-04-18 DIAGNOSIS — J3089 Other allergic rhinitis: Secondary | ICD-10-CM | POA: Diagnosis not present

## 2018-04-18 DIAGNOSIS — J3081 Allergic rhinitis due to animal (cat) (dog) hair and dander: Secondary | ICD-10-CM | POA: Diagnosis not present

## 2018-04-24 DIAGNOSIS — J3081 Allergic rhinitis due to animal (cat) (dog) hair and dander: Secondary | ICD-10-CM | POA: Diagnosis not present

## 2018-04-24 DIAGNOSIS — J3089 Other allergic rhinitis: Secondary | ICD-10-CM | POA: Diagnosis not present

## 2018-04-24 DIAGNOSIS — J301 Allergic rhinitis due to pollen: Secondary | ICD-10-CM | POA: Diagnosis not present

## 2018-05-02 DIAGNOSIS — J301 Allergic rhinitis due to pollen: Secondary | ICD-10-CM | POA: Diagnosis not present

## 2018-05-02 DIAGNOSIS — J3081 Allergic rhinitis due to animal (cat) (dog) hair and dander: Secondary | ICD-10-CM | POA: Diagnosis not present

## 2018-05-02 DIAGNOSIS — J3089 Other allergic rhinitis: Secondary | ICD-10-CM | POA: Diagnosis not present

## 2018-05-09 DIAGNOSIS — J3081 Allergic rhinitis due to animal (cat) (dog) hair and dander: Secondary | ICD-10-CM | POA: Diagnosis not present

## 2018-05-09 DIAGNOSIS — J3089 Other allergic rhinitis: Secondary | ICD-10-CM | POA: Diagnosis not present

## 2018-05-09 DIAGNOSIS — J301 Allergic rhinitis due to pollen: Secondary | ICD-10-CM | POA: Diagnosis not present

## 2018-05-18 DIAGNOSIS — J301 Allergic rhinitis due to pollen: Secondary | ICD-10-CM | POA: Diagnosis not present

## 2018-05-18 DIAGNOSIS — J3089 Other allergic rhinitis: Secondary | ICD-10-CM | POA: Diagnosis not present

## 2018-05-18 DIAGNOSIS — J3081 Allergic rhinitis due to animal (cat) (dog) hair and dander: Secondary | ICD-10-CM | POA: Diagnosis not present

## 2018-05-26 DIAGNOSIS — J301 Allergic rhinitis due to pollen: Secondary | ICD-10-CM | POA: Diagnosis not present

## 2018-05-26 DIAGNOSIS — J3089 Other allergic rhinitis: Secondary | ICD-10-CM | POA: Diagnosis not present

## 2018-05-26 DIAGNOSIS — J3081 Allergic rhinitis due to animal (cat) (dog) hair and dander: Secondary | ICD-10-CM | POA: Diagnosis not present

## 2018-06-13 DIAGNOSIS — J3081 Allergic rhinitis due to animal (cat) (dog) hair and dander: Secondary | ICD-10-CM | POA: Diagnosis not present

## 2018-06-13 DIAGNOSIS — J3089 Other allergic rhinitis: Secondary | ICD-10-CM | POA: Diagnosis not present

## 2018-06-13 DIAGNOSIS — J301 Allergic rhinitis due to pollen: Secondary | ICD-10-CM | POA: Diagnosis not present

## 2018-06-21 DIAGNOSIS — Z1389 Encounter for screening for other disorder: Secondary | ICD-10-CM | POA: Diagnosis not present

## 2018-06-21 DIAGNOSIS — J019 Acute sinusitis, unspecified: Secondary | ICD-10-CM | POA: Diagnosis not present

## 2018-06-21 DIAGNOSIS — J309 Allergic rhinitis, unspecified: Secondary | ICD-10-CM | POA: Diagnosis not present

## 2018-06-21 DIAGNOSIS — Z6825 Body mass index (BMI) 25.0-25.9, adult: Secondary | ICD-10-CM | POA: Diagnosis not present

## 2018-06-21 DIAGNOSIS — E663 Overweight: Secondary | ICD-10-CM | POA: Diagnosis not present

## 2018-06-26 DIAGNOSIS — N951 Menopausal and female climacteric states: Secondary | ICD-10-CM | POA: Diagnosis not present

## 2018-06-26 DIAGNOSIS — J301 Allergic rhinitis due to pollen: Secondary | ICD-10-CM | POA: Diagnosis not present

## 2018-06-26 DIAGNOSIS — G479 Sleep disorder, unspecified: Secondary | ICD-10-CM | POA: Diagnosis not present

## 2018-06-26 DIAGNOSIS — J3081 Allergic rhinitis due to animal (cat) (dog) hair and dander: Secondary | ICD-10-CM | POA: Diagnosis not present

## 2018-06-26 DIAGNOSIS — J3089 Other allergic rhinitis: Secondary | ICD-10-CM | POA: Diagnosis not present

## 2018-06-26 DIAGNOSIS — R5383 Other fatigue: Secondary | ICD-10-CM | POA: Diagnosis not present

## 2018-06-27 ENCOUNTER — Other Ambulatory Visit (HOSPITAL_COMMUNITY): Payer: Self-pay | Admitting: Physician Assistant

## 2018-06-27 DIAGNOSIS — E2839 Other primary ovarian failure: Secondary | ICD-10-CM

## 2018-06-29 ENCOUNTER — Encounter (HOSPITAL_COMMUNITY): Payer: Self-pay

## 2018-06-29 ENCOUNTER — Other Ambulatory Visit (HOSPITAL_COMMUNITY): Payer: 59

## 2018-07-04 DIAGNOSIS — R4586 Emotional lability: Secondary | ICD-10-CM | POA: Diagnosis not present

## 2018-07-04 DIAGNOSIS — G479 Sleep disorder, unspecified: Secondary | ICD-10-CM | POA: Diagnosis not present

## 2018-07-04 DIAGNOSIS — N951 Menopausal and female climacteric states: Secondary | ICD-10-CM | POA: Diagnosis not present

## 2018-07-19 DIAGNOSIS — J3089 Other allergic rhinitis: Secondary | ICD-10-CM | POA: Diagnosis not present

## 2018-07-19 DIAGNOSIS — J301 Allergic rhinitis due to pollen: Secondary | ICD-10-CM | POA: Diagnosis not present

## 2018-07-19 DIAGNOSIS — J3081 Allergic rhinitis due to animal (cat) (dog) hair and dander: Secondary | ICD-10-CM | POA: Diagnosis not present

## 2018-07-24 DIAGNOSIS — Z23 Encounter for immunization: Secondary | ICD-10-CM | POA: Diagnosis not present

## 2018-08-01 DIAGNOSIS — J3081 Allergic rhinitis due to animal (cat) (dog) hair and dander: Secondary | ICD-10-CM | POA: Diagnosis not present

## 2018-08-01 DIAGNOSIS — J301 Allergic rhinitis due to pollen: Secondary | ICD-10-CM | POA: Diagnosis not present

## 2018-08-01 DIAGNOSIS — J3089 Other allergic rhinitis: Secondary | ICD-10-CM | POA: Diagnosis not present

## 2018-08-08 DIAGNOSIS — J301 Allergic rhinitis due to pollen: Secondary | ICD-10-CM | POA: Diagnosis not present

## 2018-08-08 DIAGNOSIS — J3081 Allergic rhinitis due to animal (cat) (dog) hair and dander: Secondary | ICD-10-CM | POA: Diagnosis not present

## 2018-08-08 DIAGNOSIS — J3089 Other allergic rhinitis: Secondary | ICD-10-CM | POA: Diagnosis not present

## 2018-08-15 DIAGNOSIS — J301 Allergic rhinitis due to pollen: Secondary | ICD-10-CM | POA: Diagnosis not present

## 2018-08-15 DIAGNOSIS — J3081 Allergic rhinitis due to animal (cat) (dog) hair and dander: Secondary | ICD-10-CM | POA: Diagnosis not present

## 2018-08-15 DIAGNOSIS — J3089 Other allergic rhinitis: Secondary | ICD-10-CM | POA: Diagnosis not present

## 2018-08-29 DIAGNOSIS — J3089 Other allergic rhinitis: Secondary | ICD-10-CM | POA: Diagnosis not present

## 2018-08-29 DIAGNOSIS — J301 Allergic rhinitis due to pollen: Secondary | ICD-10-CM | POA: Diagnosis not present

## 2018-08-29 DIAGNOSIS — J3081 Allergic rhinitis due to animal (cat) (dog) hair and dander: Secondary | ICD-10-CM | POA: Diagnosis not present

## 2018-09-12 DIAGNOSIS — J301 Allergic rhinitis due to pollen: Secondary | ICD-10-CM | POA: Diagnosis not present

## 2018-09-12 DIAGNOSIS — J3089 Other allergic rhinitis: Secondary | ICD-10-CM | POA: Diagnosis not present

## 2018-09-12 DIAGNOSIS — J3081 Allergic rhinitis due to animal (cat) (dog) hair and dander: Secondary | ICD-10-CM | POA: Diagnosis not present

## 2018-09-19 DIAGNOSIS — J3081 Allergic rhinitis due to animal (cat) (dog) hair and dander: Secondary | ICD-10-CM | POA: Diagnosis not present

## 2018-09-19 DIAGNOSIS — J3089 Other allergic rhinitis: Secondary | ICD-10-CM | POA: Diagnosis not present

## 2018-09-19 DIAGNOSIS — J301 Allergic rhinitis due to pollen: Secondary | ICD-10-CM | POA: Diagnosis not present

## 2018-09-26 DIAGNOSIS — N951 Menopausal and female climacteric states: Secondary | ICD-10-CM | POA: Diagnosis not present

## 2018-09-26 DIAGNOSIS — R5383 Other fatigue: Secondary | ICD-10-CM | POA: Diagnosis not present

## 2018-09-26 DIAGNOSIS — R4586 Emotional lability: Secondary | ICD-10-CM | POA: Diagnosis not present

## 2018-09-26 DIAGNOSIS — G479 Sleep disorder, unspecified: Secondary | ICD-10-CM | POA: Diagnosis not present

## 2018-10-03 DIAGNOSIS — R4586 Emotional lability: Secondary | ICD-10-CM | POA: Diagnosis not present

## 2018-10-03 DIAGNOSIS — J301 Allergic rhinitis due to pollen: Secondary | ICD-10-CM | POA: Diagnosis not present

## 2018-10-03 DIAGNOSIS — N951 Menopausal and female climacteric states: Secondary | ICD-10-CM | POA: Diagnosis not present

## 2018-10-03 DIAGNOSIS — J3081 Allergic rhinitis due to animal (cat) (dog) hair and dander: Secondary | ICD-10-CM | POA: Diagnosis not present

## 2018-10-03 DIAGNOSIS — G479 Sleep disorder, unspecified: Secondary | ICD-10-CM | POA: Diagnosis not present

## 2018-10-03 DIAGNOSIS — J3089 Other allergic rhinitis: Secondary | ICD-10-CM | POA: Diagnosis not present

## 2018-10-17 DIAGNOSIS — J301 Allergic rhinitis due to pollen: Secondary | ICD-10-CM | POA: Diagnosis not present

## 2018-10-17 DIAGNOSIS — J3089 Other allergic rhinitis: Secondary | ICD-10-CM | POA: Diagnosis not present

## 2018-10-17 DIAGNOSIS — J3081 Allergic rhinitis due to animal (cat) (dog) hair and dander: Secondary | ICD-10-CM | POA: Diagnosis not present

## 2018-10-31 DIAGNOSIS — J3089 Other allergic rhinitis: Secondary | ICD-10-CM | POA: Diagnosis not present

## 2018-10-31 DIAGNOSIS — J3081 Allergic rhinitis due to animal (cat) (dog) hair and dander: Secondary | ICD-10-CM | POA: Diagnosis not present

## 2018-10-31 DIAGNOSIS — J301 Allergic rhinitis due to pollen: Secondary | ICD-10-CM | POA: Diagnosis not present

## 2018-11-08 DIAGNOSIS — J301 Allergic rhinitis due to pollen: Secondary | ICD-10-CM | POA: Diagnosis not present

## 2018-11-08 DIAGNOSIS — J3081 Allergic rhinitis due to animal (cat) (dog) hair and dander: Secondary | ICD-10-CM | POA: Diagnosis not present

## 2018-11-08 DIAGNOSIS — J3089 Other allergic rhinitis: Secondary | ICD-10-CM | POA: Diagnosis not present

## 2018-11-09 DIAGNOSIS — J3089 Other allergic rhinitis: Secondary | ICD-10-CM | POA: Diagnosis not present

## 2018-11-17 DIAGNOSIS — J3081 Allergic rhinitis due to animal (cat) (dog) hair and dander: Secondary | ICD-10-CM | POA: Diagnosis not present

## 2018-11-17 DIAGNOSIS — J301 Allergic rhinitis due to pollen: Secondary | ICD-10-CM | POA: Diagnosis not present

## 2018-11-17 DIAGNOSIS — J3089 Other allergic rhinitis: Secondary | ICD-10-CM | POA: Diagnosis not present

## 2018-11-28 DIAGNOSIS — J301 Allergic rhinitis due to pollen: Secondary | ICD-10-CM | POA: Diagnosis not present

## 2018-11-28 DIAGNOSIS — J3089 Other allergic rhinitis: Secondary | ICD-10-CM | POA: Diagnosis not present

## 2018-11-28 DIAGNOSIS — J3081 Allergic rhinitis due to animal (cat) (dog) hair and dander: Secondary | ICD-10-CM | POA: Diagnosis not present

## 2018-12-05 DIAGNOSIS — J3089 Other allergic rhinitis: Secondary | ICD-10-CM | POA: Diagnosis not present

## 2018-12-05 DIAGNOSIS — J301 Allergic rhinitis due to pollen: Secondary | ICD-10-CM | POA: Diagnosis not present

## 2018-12-05 DIAGNOSIS — J3081 Allergic rhinitis due to animal (cat) (dog) hair and dander: Secondary | ICD-10-CM | POA: Diagnosis not present

## 2018-12-12 DIAGNOSIS — J301 Allergic rhinitis due to pollen: Secondary | ICD-10-CM | POA: Diagnosis not present

## 2018-12-12 DIAGNOSIS — J3089 Other allergic rhinitis: Secondary | ICD-10-CM | POA: Diagnosis not present

## 2018-12-12 DIAGNOSIS — J3081 Allergic rhinitis due to animal (cat) (dog) hair and dander: Secondary | ICD-10-CM | POA: Diagnosis not present

## 2018-12-14 DIAGNOSIS — J3081 Allergic rhinitis due to animal (cat) (dog) hair and dander: Secondary | ICD-10-CM | POA: Diagnosis not present

## 2018-12-14 DIAGNOSIS — J301 Allergic rhinitis due to pollen: Secondary | ICD-10-CM | POA: Diagnosis not present

## 2018-12-15 ENCOUNTER — Other Ambulatory Visit: Payer: Self-pay

## 2018-12-18 ENCOUNTER — Encounter: Payer: Self-pay | Admitting: Certified Nurse Midwife

## 2018-12-18 ENCOUNTER — Other Ambulatory Visit: Payer: Self-pay

## 2018-12-18 ENCOUNTER — Ambulatory Visit: Payer: BC Managed Care – PPO | Admitting: Certified Nurse Midwife

## 2018-12-18 VITALS — BP 110/70 | HR 68 | Temp 97.2°F | Resp 16 | Ht 65.25 in | Wt 152.0 lb

## 2018-12-18 DIAGNOSIS — Z23 Encounter for immunization: Secondary | ICD-10-CM

## 2018-12-18 DIAGNOSIS — Z01419 Encounter for gynecological examination (general) (routine) without abnormal findings: Secondary | ICD-10-CM | POA: Diagnosis not present

## 2018-12-18 DIAGNOSIS — Z8659 Personal history of other mental and behavioral disorders: Secondary | ICD-10-CM

## 2018-12-18 DIAGNOSIS — Z780 Asymptomatic menopausal state: Secondary | ICD-10-CM

## 2018-12-18 NOTE — Progress Notes (Signed)
63 y.o. G0P0000 Widowed  Caucasian Fe here for annual exam.  Menopausal, denies vaginal bleeding or vaginal dryness. Continues with [employer] Progesterone and Estrogen pellets which has helped her feel more herself. Planted garden and enjoying this and putting up vegetables. Father diet in 07/2018, no funeral yet. Mother has dementia and she is caring for her. Feels she is working through her spouse death in the past 2 years. Continues with grief group share online. Feels she may need to seek new Psychiatrist with [facility]. Plans to reschedule with PCP for aex and labs. No other health issues today.  No LMP recorded. (Menstrual status: Other).          Sexually active: No.  The current method of family planning is tubal ligation.    Exercising: Yes.    golf, gardening Smoker:  no  Review of Systems  Constitutional: Negative.   HENT: Negative.   Eyes: Negative.   Respiratory: Negative.   Cardiovascular: Negative.   Gastrointestinal: Negative.   Genitourinary: Negative.   Musculoskeletal: Negative.   Skin: Negative.   Neurological: Negative.   Endo/Heme/Allergies: Negative.   Psychiatric/Behavioral: Negative.     Health Maintenance: Pap:  12-15-17 neg HPV HR neg History of Abnormal Pap: yes MMG:  11-21-2017 category b density birads 1:neg Self Breast exams: yes Colonoscopy:  2012 per patient BMD:   2010 discussed having with next mammogram TDaP:  2010 Shingles: no Pneumonia: no Hep C and HIV: both neg 2016 Labs: if needed.   reports that she has quit smoking. She has never used smokeless tobacco. She reports current alcohol use of about 15.0 standard drinks of alcohol per week. She reports that she does not use drugs.  Past Medical History:  Diagnosis Date  . Abdominal pain, epigastric   . Abnormal Pap smear of cervix    11-01-14 LGSIL  . Arthritis    in back  . Asthma    as a child  . Constipation   . History of Salmonella infection   . HSV infection   . Irritable bowel  syndrome   . Melanoma of skin, site unspecified   . Rotator cuff syndrome of left shoulder   . Seasonal allergies   . Shoulder pain     Past Surgical History:  Procedure Laterality Date  . COLPOSCOPY  2016  . DILATION AND CURETTAGE OF UTERUS  1997   bleeding  . MOLE REMOVAL     cancer  . SKIN GRAFT    . Thermal Ablation     of the uterus  . TUBAL LIGATION      Current Outpatient Medications  Medication Sig Dispense Refill  . Cyanocobalamin (VITAMIN B-12) 2500 MCG SUBL Place under the tongue daily.     . fluticasone (FLONASE) 50 MCG/ACT nasal spray daily.    Marland Kitchen levocetirizine (XYZAL) 5 MG tablet at bedtime.    . montelukast (SINGULAIR) 10 MG tablet as needed.     . progesterone (PROMETRIUM) 100 MG capsule     . UNABLE TO FIND Allergy shots    . UNABLE TO FIND Estrogen/testosterone pellet    . valACYclovir (VALTREX) 500 MG tablet Take 1 tablet (500 mg total) by mouth daily. 30 tablet 2   No current facility-administered medications for this visit.     Family History  Problem Relation Age of Onset  . Leukemia Brother   . Hypertension Mother   . Heart attack Mother   . Fibroids Mother        h/x  hysterectomy  . Hypothyroidism Mother   . Diabetes Mother   . Acromegaly Mother   . Hypertension Father   . Kidney Stones Father   . Diabetes Father   . Heart block Father        stint put in  . Diabetes Maternal Grandmother   . Heart failure Maternal Grandmother   . Depression Maternal Grandfather   . Parkinson's disease Maternal Grandfather   . Cancer Maternal Grandfather        brain  . Breast cancer Paternal Aunt   . Breast cancer Paternal Aunt     ROS:  Pertinent items are noted in HPI.  Otherwise, a comprehensive ROS was negative.  Exam:   There were no vitals taken for this visit.   Ht Readings from Last 3 Encounters:  03/09/18 5' 5.25" (1.657 m)  12/15/17 5' 5.25" (1.657 m)  11/19/16 5' 5.25" (1.657 m)    General appearance: alert, cooperative and  appears stated age Head: Normocephalic, without obvious abnormality, atraumatic Neck: no adenopathy, supple, symmetrical, trachea midline and thyroid normal to inspection and palpation Lungs: clear to auscultation bilaterally Breasts: normal appearance, no masses or tenderness, No nipple retraction or dimpling, No nipple discharge or bleeding, No axillary or supraclavicular adenopathy Heart: regular rate and rhythm Abdomen: soft, non-tender; no masses,  no organomegaly Extremities: extremities normal, atraumatic, no cyanosis or edema Skin: Skin color, texture, turgor normal. No rashes or lesions Lymph nodes: Cervical, supraclavicular, and axillary nodes normal. No abnormal inguinal nodes palpated Neurologic: Grossly normal   Pelvic: External genitalia:  no lesions              Urethra:  normal appearing urethra with no masses, tenderness or lesions              Bartholin's and Skene's: normal                 Vagina: normal appearing vagina with normal color and discharge, no lesions              Cervix: no cervical motion tenderness, no lesions and nulliparous appearance              Pap taken: No. Bimanual Exam:  Uterus:  normal size, contour, position, consistency, mobility, non-tender              Adnexa: normal adnexa and no mass, fullness, tenderness               Rectovaginal: Confirms               Anus:  normal sphincter tone, no lesions  Chaperone present: yes  A:  Well Woman with normal exam  Menopausal on HRT with [employer], labs with each visit  PCP management of allergies   Mammogram and BMD due will reschedule  Immunization due    P:   Reviewed health and wellness pertinent to exam  Discussed continued follow up as instructed for HRT use.  Continue MD follow up with allergies and encouraged her to scheduled with another psychiatrist in the [facility] for her support. Patient plans to call  Stressed mammogram and BMD scheduling.  Requests TDAP  Pap smear:  no   counseled on breast self exam, mammography screening, feminine hygiene, menopause, adequate intake of calcium and vitamin D, diet and exercise  return annually or prn  An After Visit Summary was printed and given to the patient.

## 2018-12-19 DIAGNOSIS — J3081 Allergic rhinitis due to animal (cat) (dog) hair and dander: Secondary | ICD-10-CM | POA: Diagnosis not present

## 2018-12-19 DIAGNOSIS — J301 Allergic rhinitis due to pollen: Secondary | ICD-10-CM | POA: Diagnosis not present

## 2018-12-19 DIAGNOSIS — J3089 Other allergic rhinitis: Secondary | ICD-10-CM | POA: Diagnosis not present

## 2018-12-21 ENCOUNTER — Other Ambulatory Visit (HOSPITAL_COMMUNITY): Payer: Self-pay | Admitting: Physician Assistant

## 2018-12-21 ENCOUNTER — Ambulatory Visit: Payer: 59 | Admitting: Certified Nurse Midwife

## 2018-12-21 DIAGNOSIS — Z1231 Encounter for screening mammogram for malignant neoplasm of breast: Secondary | ICD-10-CM

## 2018-12-26 DIAGNOSIS — J3089 Other allergic rhinitis: Secondary | ICD-10-CM | POA: Diagnosis not present

## 2018-12-26 DIAGNOSIS — J3081 Allergic rhinitis due to animal (cat) (dog) hair and dander: Secondary | ICD-10-CM | POA: Diagnosis not present

## 2018-12-26 DIAGNOSIS — J301 Allergic rhinitis due to pollen: Secondary | ICD-10-CM | POA: Diagnosis not present

## 2019-01-01 DIAGNOSIS — J3081 Allergic rhinitis due to animal (cat) (dog) hair and dander: Secondary | ICD-10-CM | POA: Diagnosis not present

## 2019-01-01 DIAGNOSIS — J3089 Other allergic rhinitis: Secondary | ICD-10-CM | POA: Diagnosis not present

## 2019-01-01 DIAGNOSIS — J301 Allergic rhinitis due to pollen: Secondary | ICD-10-CM | POA: Diagnosis not present

## 2019-01-04 ENCOUNTER — Ambulatory Visit (HOSPITAL_COMMUNITY)
Admission: RE | Admit: 2019-01-04 | Discharge: 2019-01-04 | Disposition: A | Discharge status: Home / Self Care | Payer: BC Managed Care – PPO | Source: Ambulatory Visit | Attending: Physician Assistant | Admitting: Physician Assistant

## 2019-01-04 ENCOUNTER — Encounter (HOSPITAL_COMMUNITY): Payer: Self-pay

## 2019-01-04 ENCOUNTER — Other Ambulatory Visit: Payer: Self-pay

## 2019-01-04 DIAGNOSIS — Z1231 Encounter for screening mammogram for malignant neoplasm of breast: Secondary | ICD-10-CM

## 2019-01-04 DIAGNOSIS — E2839 Other primary ovarian failure: Secondary | ICD-10-CM | POA: Insufficient documentation

## 2019-01-04 DIAGNOSIS — Z780 Asymptomatic menopausal state: Secondary | ICD-10-CM | POA: Diagnosis not present

## 2019-01-04 DIAGNOSIS — M8589 Other specified disorders of bone density and structure, multiple sites: Secondary | ICD-10-CM | POA: Diagnosis not present

## 2019-01-15 ENCOUNTER — Other Ambulatory Visit: Payer: Self-pay | Admitting: *Deleted

## 2019-01-15 DIAGNOSIS — Z20822 Contact with and (suspected) exposure to covid-19: Secondary | ICD-10-CM

## 2019-01-16 DIAGNOSIS — J301 Allergic rhinitis due to pollen: Secondary | ICD-10-CM | POA: Diagnosis not present

## 2019-01-16 DIAGNOSIS — L570 Actinic keratosis: Secondary | ICD-10-CM | POA: Diagnosis not present

## 2019-01-16 DIAGNOSIS — R5383 Other fatigue: Secondary | ICD-10-CM | POA: Diagnosis not present

## 2019-01-16 DIAGNOSIS — J3081 Allergic rhinitis due to animal (cat) (dog) hair and dander: Secondary | ICD-10-CM | POA: Diagnosis not present

## 2019-01-16 DIAGNOSIS — Z85820 Personal history of malignant melanoma of skin: Secondary | ICD-10-CM | POA: Diagnosis not present

## 2019-01-16 DIAGNOSIS — J3089 Other allergic rhinitis: Secondary | ICD-10-CM | POA: Diagnosis not present

## 2019-01-16 DIAGNOSIS — L218 Other seborrheic dermatitis: Secondary | ICD-10-CM | POA: Diagnosis not present

## 2019-01-16 DIAGNOSIS — D1801 Hemangioma of skin and subcutaneous tissue: Secondary | ICD-10-CM | POA: Diagnosis not present

## 2019-01-16 DIAGNOSIS — L820 Inflamed seborrheic keratosis: Secondary | ICD-10-CM | POA: Diagnosis not present

## 2019-01-16 DIAGNOSIS — D225 Melanocytic nevi of trunk: Secondary | ICD-10-CM | POA: Diagnosis not present

## 2019-01-16 DIAGNOSIS — G479 Sleep disorder, unspecified: Secondary | ICD-10-CM | POA: Diagnosis not present

## 2019-01-16 DIAGNOSIS — R4586 Emotional lability: Secondary | ICD-10-CM | POA: Diagnosis not present

## 2019-01-16 DIAGNOSIS — N951 Menopausal and female climacteric states: Secondary | ICD-10-CM | POA: Diagnosis not present

## 2019-01-16 LAB — NOVEL CORONAVIRUS, NAA: SARS-CoV-2, NAA: NOT DETECTED

## 2019-01-17 DIAGNOSIS — K645 Perianal venous thrombosis: Secondary | ICD-10-CM | POA: Diagnosis not present

## 2019-01-17 DIAGNOSIS — Z6824 Body mass index (BMI) 24.0-24.9, adult: Secondary | ICD-10-CM | POA: Diagnosis not present

## 2019-02-27 ENCOUNTER — Other Ambulatory Visit: Payer: Self-pay | Admitting: *Deleted

## 2019-02-27 DIAGNOSIS — Z20822 Contact with and (suspected) exposure to covid-19: Secondary | ICD-10-CM

## 2019-03-02 ENCOUNTER — Telehealth: Payer: Self-pay | Admitting: Family Medicine

## 2019-03-02 LAB — NOVEL CORONAVIRUS, NAA: SARS-CoV-2, NAA: NOT DETECTED

## 2019-03-02 NOTE — Telephone Encounter (Signed)
Pt aware covid lab test negative, not detected °

## 2019-08-15 ENCOUNTER — Encounter: Payer: Self-pay | Admitting: Certified Nurse Midwife

## 2019-09-25 DIAGNOSIS — J301 Allergic rhinitis due to pollen: Secondary | ICD-10-CM | POA: Diagnosis not present

## 2019-09-25 DIAGNOSIS — J3081 Allergic rhinitis due to animal (cat) (dog) hair and dander: Secondary | ICD-10-CM | POA: Diagnosis not present

## 2019-09-25 DIAGNOSIS — J3089 Other allergic rhinitis: Secondary | ICD-10-CM | POA: Diagnosis not present

## 2019-10-02 DIAGNOSIS — J3081 Allergic rhinitis due to animal (cat) (dog) hair and dander: Secondary | ICD-10-CM | POA: Diagnosis not present

## 2019-10-02 DIAGNOSIS — J3089 Other allergic rhinitis: Secondary | ICD-10-CM | POA: Diagnosis not present

## 2019-10-02 DIAGNOSIS — J301 Allergic rhinitis due to pollen: Secondary | ICD-10-CM | POA: Diagnosis not present

## 2019-10-09 DIAGNOSIS — J301 Allergic rhinitis due to pollen: Secondary | ICD-10-CM | POA: Diagnosis not present

## 2019-10-09 DIAGNOSIS — J3081 Allergic rhinitis due to animal (cat) (dog) hair and dander: Secondary | ICD-10-CM | POA: Diagnosis not present

## 2019-10-09 DIAGNOSIS — J3089 Other allergic rhinitis: Secondary | ICD-10-CM | POA: Diagnosis not present

## 2019-10-16 DIAGNOSIS — J3089 Other allergic rhinitis: Secondary | ICD-10-CM | POA: Diagnosis not present

## 2019-10-16 DIAGNOSIS — J3081 Allergic rhinitis due to animal (cat) (dog) hair and dander: Secondary | ICD-10-CM | POA: Diagnosis not present

## 2019-10-16 DIAGNOSIS — J301 Allergic rhinitis due to pollen: Secondary | ICD-10-CM | POA: Diagnosis not present

## 2019-10-30 DIAGNOSIS — J3081 Allergic rhinitis due to animal (cat) (dog) hair and dander: Secondary | ICD-10-CM | POA: Diagnosis not present

## 2019-10-30 DIAGNOSIS — J301 Allergic rhinitis due to pollen: Secondary | ICD-10-CM | POA: Diagnosis not present

## 2019-10-30 DIAGNOSIS — J3089 Other allergic rhinitis: Secondary | ICD-10-CM | POA: Diagnosis not present

## 2019-11-06 DIAGNOSIS — J301 Allergic rhinitis due to pollen: Secondary | ICD-10-CM | POA: Diagnosis not present

## 2019-11-06 DIAGNOSIS — J3089 Other allergic rhinitis: Secondary | ICD-10-CM | POA: Diagnosis not present

## 2019-11-06 DIAGNOSIS — J3081 Allergic rhinitis due to animal (cat) (dog) hair and dander: Secondary | ICD-10-CM | POA: Diagnosis not present

## 2019-11-19 DIAGNOSIS — G479 Sleep disorder, unspecified: Secondary | ICD-10-CM | POA: Diagnosis not present

## 2019-11-19 DIAGNOSIS — R5383 Other fatigue: Secondary | ICD-10-CM | POA: Diagnosis not present

## 2019-11-19 DIAGNOSIS — N951 Menopausal and female climacteric states: Secondary | ICD-10-CM | POA: Diagnosis not present

## 2019-11-19 DIAGNOSIS — R6882 Decreased libido: Secondary | ICD-10-CM | POA: Diagnosis not present

## 2019-11-22 DIAGNOSIS — Z6823 Body mass index (BMI) 23.0-23.9, adult: Secondary | ICD-10-CM | POA: Diagnosis not present

## 2019-11-22 DIAGNOSIS — J019 Acute sinusitis, unspecified: Secondary | ICD-10-CM | POA: Diagnosis not present

## 2019-11-22 DIAGNOSIS — J301 Allergic rhinitis due to pollen: Secondary | ICD-10-CM | POA: Diagnosis not present

## 2019-11-27 DIAGNOSIS — J3089 Other allergic rhinitis: Secondary | ICD-10-CM | POA: Diagnosis not present

## 2019-11-27 DIAGNOSIS — J301 Allergic rhinitis due to pollen: Secondary | ICD-10-CM | POA: Diagnosis not present

## 2019-11-27 DIAGNOSIS — G479 Sleep disorder, unspecified: Secondary | ICD-10-CM | POA: Diagnosis not present

## 2019-11-27 DIAGNOSIS — J3081 Allergic rhinitis due to animal (cat) (dog) hair and dander: Secondary | ICD-10-CM | POA: Diagnosis not present

## 2019-11-27 DIAGNOSIS — N951 Menopausal and female climacteric states: Secondary | ICD-10-CM | POA: Diagnosis not present

## 2019-11-27 DIAGNOSIS — R4586 Emotional lability: Secondary | ICD-10-CM | POA: Diagnosis not present

## 2019-11-29 ENCOUNTER — Other Ambulatory Visit (HOSPITAL_COMMUNITY): Payer: Self-pay | Admitting: Family Medicine

## 2019-11-29 DIAGNOSIS — Z1231 Encounter for screening mammogram for malignant neoplasm of breast: Secondary | ICD-10-CM

## 2019-12-03 DIAGNOSIS — H43393 Other vitreous opacities, bilateral: Secondary | ICD-10-CM | POA: Diagnosis not present

## 2019-12-04 DIAGNOSIS — J301 Allergic rhinitis due to pollen: Secondary | ICD-10-CM | POA: Diagnosis not present

## 2019-12-04 DIAGNOSIS — J3089 Other allergic rhinitis: Secondary | ICD-10-CM | POA: Diagnosis not present

## 2019-12-04 DIAGNOSIS — J3081 Allergic rhinitis due to animal (cat) (dog) hair and dander: Secondary | ICD-10-CM | POA: Diagnosis not present

## 2019-12-10 DIAGNOSIS — J3089 Other allergic rhinitis: Secondary | ICD-10-CM | POA: Diagnosis not present

## 2019-12-10 DIAGNOSIS — J3081 Allergic rhinitis due to animal (cat) (dog) hair and dander: Secondary | ICD-10-CM | POA: Diagnosis not present

## 2019-12-10 DIAGNOSIS — J301 Allergic rhinitis due to pollen: Secondary | ICD-10-CM | POA: Diagnosis not present

## 2019-12-17 DIAGNOSIS — J301 Allergic rhinitis due to pollen: Secondary | ICD-10-CM | POA: Diagnosis not present

## 2019-12-17 DIAGNOSIS — J3081 Allergic rhinitis due to animal (cat) (dog) hair and dander: Secondary | ICD-10-CM | POA: Diagnosis not present

## 2019-12-17 DIAGNOSIS — J3089 Other allergic rhinitis: Secondary | ICD-10-CM | POA: Diagnosis not present

## 2019-12-18 DIAGNOSIS — J3089 Other allergic rhinitis: Secondary | ICD-10-CM | POA: Diagnosis not present

## 2019-12-24 ENCOUNTER — Ambulatory Visit: Payer: BC Managed Care – PPO | Admitting: Certified Nurse Midwife

## 2019-12-24 ENCOUNTER — Encounter: Payer: Self-pay | Admitting: Obstetrics and Gynecology

## 2019-12-24 ENCOUNTER — Other Ambulatory Visit: Payer: Self-pay

## 2019-12-24 ENCOUNTER — Ambulatory Visit: Payer: BC Managed Care – PPO | Admitting: Obstetrics and Gynecology

## 2019-12-24 VITALS — BP 108/60 | HR 60 | Resp 12 | Ht 65.0 in | Wt 142.0 lb

## 2019-12-24 DIAGNOSIS — Z01419 Encounter for gynecological examination (general) (routine) without abnormal findings: Secondary | ICD-10-CM | POA: Diagnosis not present

## 2019-12-24 DIAGNOSIS — J3081 Allergic rhinitis due to animal (cat) (dog) hair and dander: Secondary | ICD-10-CM | POA: Diagnosis not present

## 2019-12-24 DIAGNOSIS — J301 Allergic rhinitis due to pollen: Secondary | ICD-10-CM | POA: Diagnosis not present

## 2019-12-24 DIAGNOSIS — J3089 Other allergic rhinitis: Secondary | ICD-10-CM | POA: Diagnosis not present

## 2019-12-24 MED ORDER — VALACYCLOVIR HCL 500 MG PO TABS: 500.0000 mg | ORAL_TABLET | Freq: Two times a day (BID) | ORAL | 2 refills | Status: DC

## 2019-12-24 NOTE — Progress Notes (Signed)
64 y.o. G0P0000 Widowed Caucasian female here for annual exam.    Denies any further bleeding.  Seeing [facility] and taking estrogen and testosterone pellets every 3 - 4 months and Prometrium 100 mg every other day on her own.   Likes to garden - fruit and vegetables.  Is a caregiver for an elderly couple.   Partner died from the flu in June 25, 2017.  Probably MI.   Dad passed away in 08/24/22.  Did her Covid vaccine.   PCP: Sharilyn Sites, MD    Patient's last menstrual period was 02/06/2018 (exact date).           Sexually active: No.  The current method of family planning is tubal ligation.    Exercising: No.  Gardening and playing golf Smoker:  no  Health Maintenance: Pap: 12-15-17 Neg:Neg HR HPV, 11-11-15 Neg:Neg HR HPV, 06-26-15 ASCUS:Neg HR HPV History of abnormal Pap:  Yes, 06-26-15 ASCUS:Neg HR HPV,  11-01-14 LGSIL, colpo HPV effect & benign endocervical glandular tissue. MMG:  01-04-19 3D/Neg/density B/Birads1.  She has appointment.  Colonoscopy: 24-Aug-2010 normal per patient BMD:  01-04-19  Result :osteopenia TDaP: 12-18-18 Gardasil:   no HIV: 08-24-2014 NR Hep C: August 24, 2014 Neg Screening Labs:  PCP.   reports that she has been smoking. She has been smoking about 0.50 packs per day. She has never used smokeless tobacco. She reports current alcohol use of about 15.0 standard drinks of alcohol per week. She reports that she does not use drugs.  Past Medical History:  Diagnosis Date  . Abdominal pain, epigastric   . Abnormal Pap smear of cervix    11-01-14 LGSIL  . Arthritis    in back  . Asthma    as a child  . Constipation   . History of Salmonella infection   . HSV infection   . Irritable bowel syndrome   . Melanoma of skin, site unspecified   . Rotator cuff syndrome of left shoulder   . Seasonal allergies   . Shoulder pain     Past Surgical History:  Procedure Laterality Date  . COLPOSCOPY  24-Aug-2014  . DILATION AND CURETTAGE OF UTERUS  1997   bleeding  . MOLE REMOVAL     cancer   . SKIN GRAFT    . Thermal Ablation     of the uterus  . TUBAL LIGATION      Current Outpatient Medications  Medication Sig Dispense Refill  . CHANTIX STARTING MONTH PAK 0.5 MG X 11 & 1 MG X 42 tablet Take by mouth.    . Cyanocobalamin (VITAMIN B-12) 2500 MCG SUBL Place under the tongue daily.     Marland Kitchen EPINEPHrine 0.3 mg/0.3 mL IJ SOAJ injection Inject into the muscle.    . fluticasone (FLONASE) 50 MCG/ACT nasal spray daily.    Marland Kitchen levocetirizine (XYZAL) 5 MG tablet at bedtime.    . montelukast (SINGULAIR) 10 MG tablet as needed.     . progesterone (PROMETRIUM) 100 MG capsule     . UNABLE TO FIND Allergy shots    . UNABLE TO FIND Estrogen/testosterone pellet    . valACYclovir (VALTREX) 500 MG tablet Take 1 tablet (500 mg total) by mouth daily. 30 tablet 2   No current facility-administered medications for this visit.    Family History  Problem Relation Age of Onset  . Leukemia Brother   . Hypertension Mother   . Heart attack Mother   . Fibroids Mother        h/x  hysterectomy  . Hypothyroidism Mother   . Diabetes Mother   . Acromegaly Mother   . Hypertension Father   . Kidney Stones Father   . Diabetes Father   . Heart block Father        stint put in  . Diabetes Maternal Grandmother   . Heart failure Maternal Grandmother   . Depression Maternal Grandfather   . Parkinson's disease Maternal Grandfather   . Cancer Maternal Grandfather        brain  . Breast cancer Paternal Aunt   . Breast cancer Paternal Aunt     Review of Systems  All other systems reviewed and are negative.   Exam:   BP 108/60   Pulse 60   Resp 12   Ht 5\' 5"  (1.651 m)   Wt 142 lb (64.4 kg)   LMP 02/06/2018 (Exact Date)   BMI 23.63 kg/m     General appearance: alert, cooperative and appears stated age Head: normocephalic, without obvious abnormality, atraumatic Neck: no adenopathy, supple, symmetrical, trachea midline and thyroid normal to inspection and palpation Lungs: clear to auscultation  bilaterally Breasts: normal appearance, no masses or tenderness, No nipple retraction or dimpling, No nipple discharge or bleeding, No axillary adenopathy Heart: regular rate and rhythm Abdomen: soft, non-tender; no masses, no organomegaly Extremities: extremities normal, atraumatic, no cyanosis or edema Skin: skin color, texture, turgor normal. No rashes or lesions Lymph nodes: cervical, supraclavicular, and axillary nodes normal. Neurologic: grossly normal  Pelvic: External genitalia:  no lesions              No abnormal inguinal nodes palpated.              Urethra:  normal appearing urethra with no masses, tenderness or lesions              Bartholins and Skenes: normal                 Vagina: normal appearing vagina with normal color and discharge, no lesions              Cervix: no lesions              Pap taken: No. Bimanual Exam:  Uterus:  normal size, contour, position, consistency, mobility, non-tender              Adnexa: no mass, fullness, tenderness              Rectal exam: Yes.  .  Confirms.              Anus:  normal sphincter tone, no lesions  Chaperone was present for exam.  Assessment:   Well woman visit with normal exam. Hx postmenopausal bleeding.   Benign biopsy.  HRT and testosterone tx at [facility].  Hx HSV.  Hx LGSIL.  Hx melanoma.  Smoker.  Bereavement.    Plan: Mammogram screening discussed. Self breast awareness reviewed. Pap and HR HPV in 2022.  5 year risk of CIN III is 0.18%.  Guidelines for Calcium, Vitamin D, regular exercise program including cardiovascular and weight bearing exercise. Take Prometrium daily to reduce risk of uterine cancer.  Discused WHI and use of HRT which can increase risk of PE, DVT, MI, stroke and breast cancer.  BMD in 2022. Smoking cessation reviewed.  Sees Dermatology.  Support given for losses. Follow up annually and prn.   After visit summary provided.

## 2019-12-24 NOTE — Patient Instructions (Signed)

## 2019-12-25 ENCOUNTER — Telehealth: Payer: Self-pay | Admitting: Obstetrics and Gynecology

## 2019-12-25 NOTE — Telephone Encounter (Signed)
Lease enter pap recall for 2022.   Patient has a history of LGSIL in 2016 and a an ASCUS pap in follow up.

## 2019-12-31 DIAGNOSIS — J3089 Other allergic rhinitis: Secondary | ICD-10-CM | POA: Diagnosis not present

## 2019-12-31 DIAGNOSIS — J301 Allergic rhinitis due to pollen: Secondary | ICD-10-CM | POA: Diagnosis not present

## 2019-12-31 DIAGNOSIS — J3081 Allergic rhinitis due to animal (cat) (dog) hair and dander: Secondary | ICD-10-CM | POA: Diagnosis not present

## 2020-01-07 DIAGNOSIS — J3081 Allergic rhinitis due to animal (cat) (dog) hair and dander: Secondary | ICD-10-CM | POA: Diagnosis not present

## 2020-01-07 DIAGNOSIS — J3089 Other allergic rhinitis: Secondary | ICD-10-CM | POA: Diagnosis not present

## 2020-01-07 DIAGNOSIS — J301 Allergic rhinitis due to pollen: Secondary | ICD-10-CM | POA: Diagnosis not present

## 2020-01-09 ENCOUNTER — Other Ambulatory Visit: Payer: Self-pay

## 2020-01-09 ENCOUNTER — Ambulatory Visit (HOSPITAL_COMMUNITY)
Admission: RE | Admit: 2020-01-09 | Discharge: 2020-01-09 | Disposition: A | Discharge status: Home / Self Care | Payer: BC Managed Care – PPO | Source: Ambulatory Visit | Attending: Family Medicine | Admitting: Family Medicine

## 2020-01-09 DIAGNOSIS — Z1231 Encounter for screening mammogram for malignant neoplasm of breast: Secondary | ICD-10-CM | POA: Insufficient documentation

## 2020-01-09 NOTE — Telephone Encounter (Signed)
Recall entered.  Encounter closed

## 2020-01-14 DIAGNOSIS — J3081 Allergic rhinitis due to animal (cat) (dog) hair and dander: Secondary | ICD-10-CM | POA: Diagnosis not present

## 2020-01-14 DIAGNOSIS — J301 Allergic rhinitis due to pollen: Secondary | ICD-10-CM | POA: Diagnosis not present

## 2020-01-14 DIAGNOSIS — J3089 Other allergic rhinitis: Secondary | ICD-10-CM | POA: Diagnosis not present

## 2020-01-15 ENCOUNTER — Other Ambulatory Visit (HOSPITAL_COMMUNITY): Payer: Self-pay | Admitting: Physician Assistant

## 2020-01-15 DIAGNOSIS — R928 Other abnormal and inconclusive findings on diagnostic imaging of breast: Secondary | ICD-10-CM

## 2020-01-21 DIAGNOSIS — J3089 Other allergic rhinitis: Secondary | ICD-10-CM | POA: Diagnosis not present

## 2020-01-21 DIAGNOSIS — J3081 Allergic rhinitis due to animal (cat) (dog) hair and dander: Secondary | ICD-10-CM | POA: Diagnosis not present

## 2020-01-21 DIAGNOSIS — J301 Allergic rhinitis due to pollen: Secondary | ICD-10-CM | POA: Diagnosis not present

## 2020-01-22 ENCOUNTER — Other Ambulatory Visit: Payer: Self-pay

## 2020-01-22 ENCOUNTER — Ambulatory Visit (HOSPITAL_COMMUNITY)
Admission: RE | Admit: 2020-01-22 | Discharge: 2020-01-22 | Disposition: A | Discharge status: Home / Self Care | Payer: BC Managed Care – PPO | Source: Ambulatory Visit | Attending: Physician Assistant | Admitting: Physician Assistant

## 2020-01-22 DIAGNOSIS — L570 Actinic keratosis: Secondary | ICD-10-CM | POA: Diagnosis not present

## 2020-01-22 DIAGNOSIS — R928 Other abnormal and inconclusive findings on diagnostic imaging of breast: Secondary | ICD-10-CM

## 2020-01-22 DIAGNOSIS — N6314 Unspecified lump in the right breast, lower inner quadrant: Secondary | ICD-10-CM | POA: Diagnosis not present

## 2020-01-22 DIAGNOSIS — L814 Other melanin hyperpigmentation: Secondary | ICD-10-CM | POA: Diagnosis not present

## 2020-01-22 DIAGNOSIS — D1801 Hemangioma of skin and subcutaneous tissue: Secondary | ICD-10-CM | POA: Diagnosis not present

## 2020-01-22 DIAGNOSIS — D225 Melanocytic nevi of trunk: Secondary | ICD-10-CM | POA: Diagnosis not present

## 2020-01-22 DIAGNOSIS — L821 Other seborrheic keratosis: Secondary | ICD-10-CM | POA: Diagnosis not present

## 2020-01-22 DIAGNOSIS — N6313 Unspecified lump in the right breast, lower outer quadrant: Secondary | ICD-10-CM | POA: Diagnosis not present

## 2020-01-22 DIAGNOSIS — R922 Inconclusive mammogram: Secondary | ICD-10-CM | POA: Diagnosis not present

## 2020-02-18 ENCOUNTER — Other Ambulatory Visit: Payer: BC Managed Care – PPO

## 2020-02-18 ENCOUNTER — Other Ambulatory Visit: Payer: Self-pay

## 2020-02-18 DIAGNOSIS — Z20822 Contact with and (suspected) exposure to covid-19: Secondary | ICD-10-CM | POA: Diagnosis not present

## 2020-02-20 LAB — SARS-COV-2, NAA 2 DAY TAT

## 2020-02-20 LAB — SPECIMEN STATUS REPORT

## 2020-02-20 LAB — NOVEL CORONAVIRUS, NAA: SARS-CoV-2, NAA: NOT DETECTED

## 2020-02-26 DIAGNOSIS — J301 Allergic rhinitis due to pollen: Secondary | ICD-10-CM | POA: Diagnosis not present

## 2020-02-26 DIAGNOSIS — T781XXD Other adverse food reactions, not elsewhere classified, subsequent encounter: Secondary | ICD-10-CM | POA: Diagnosis not present

## 2020-02-26 DIAGNOSIS — R5383 Other fatigue: Secondary | ICD-10-CM | POA: Diagnosis not present

## 2020-02-26 DIAGNOSIS — N951 Menopausal and female climacteric states: Secondary | ICD-10-CM | POA: Diagnosis not present

## 2020-02-26 DIAGNOSIS — J3081 Allergic rhinitis due to animal (cat) (dog) hair and dander: Secondary | ICD-10-CM | POA: Diagnosis not present

## 2020-02-26 DIAGNOSIS — T63441A Toxic effect of venom of bees, accidental (unintentional), initial encounter: Secondary | ICD-10-CM | POA: Diagnosis not present

## 2020-02-26 DIAGNOSIS — J3089 Other allergic rhinitis: Secondary | ICD-10-CM | POA: Diagnosis not present

## 2020-03-03 DIAGNOSIS — J301 Allergic rhinitis due to pollen: Secondary | ICD-10-CM | POA: Diagnosis not present

## 2020-03-03 DIAGNOSIS — J3089 Other allergic rhinitis: Secondary | ICD-10-CM | POA: Diagnosis not present

## 2020-03-03 DIAGNOSIS — N951 Menopausal and female climacteric states: Secondary | ICD-10-CM | POA: Diagnosis not present

## 2020-03-03 DIAGNOSIS — M2550 Pain in unspecified joint: Secondary | ICD-10-CM | POA: Diagnosis not present

## 2020-03-03 DIAGNOSIS — N898 Other specified noninflammatory disorders of vagina: Secondary | ICD-10-CM | POA: Diagnosis not present

## 2020-03-03 DIAGNOSIS — J3081 Allergic rhinitis due to animal (cat) (dog) hair and dander: Secondary | ICD-10-CM | POA: Diagnosis not present

## 2020-03-10 DIAGNOSIS — J301 Allergic rhinitis due to pollen: Secondary | ICD-10-CM | POA: Diagnosis not present

## 2020-03-10 DIAGNOSIS — J3089 Other allergic rhinitis: Secondary | ICD-10-CM | POA: Diagnosis not present

## 2020-03-10 DIAGNOSIS — J3081 Allergic rhinitis due to animal (cat) (dog) hair and dander: Secondary | ICD-10-CM | POA: Diagnosis not present

## 2020-03-10 IMAGING — MG DIGITAL SCREENING BILATERAL MAMMOGRAM WITH TOMO AND CAD
8 series · 9 of 24 positions shown · non-contrast
Comparison: Previous exam(s).

CLINICAL DATA: Screening.

EXAM:
DIGITAL SCREENING BILATERAL MAMMOGRAM WITH TOMO AND CAD

[L CC synth-2D]
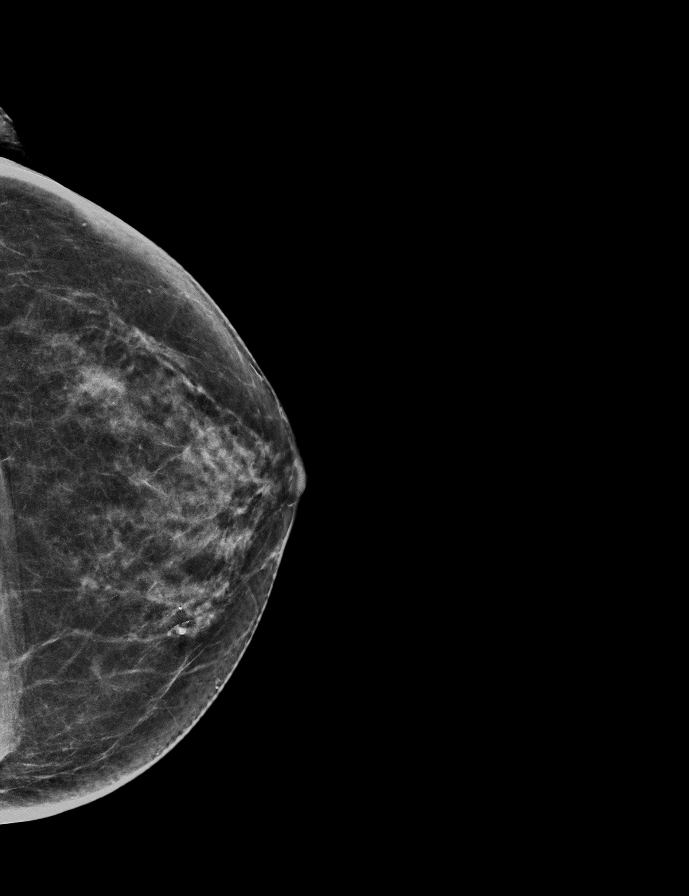

[L MLO synth-2D]
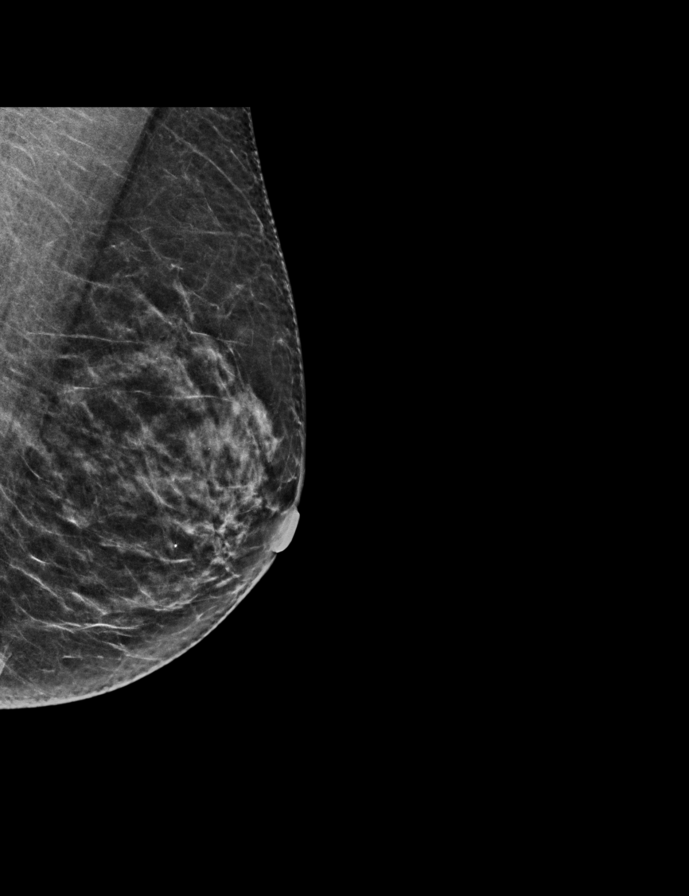

[R MLO synth-2D]
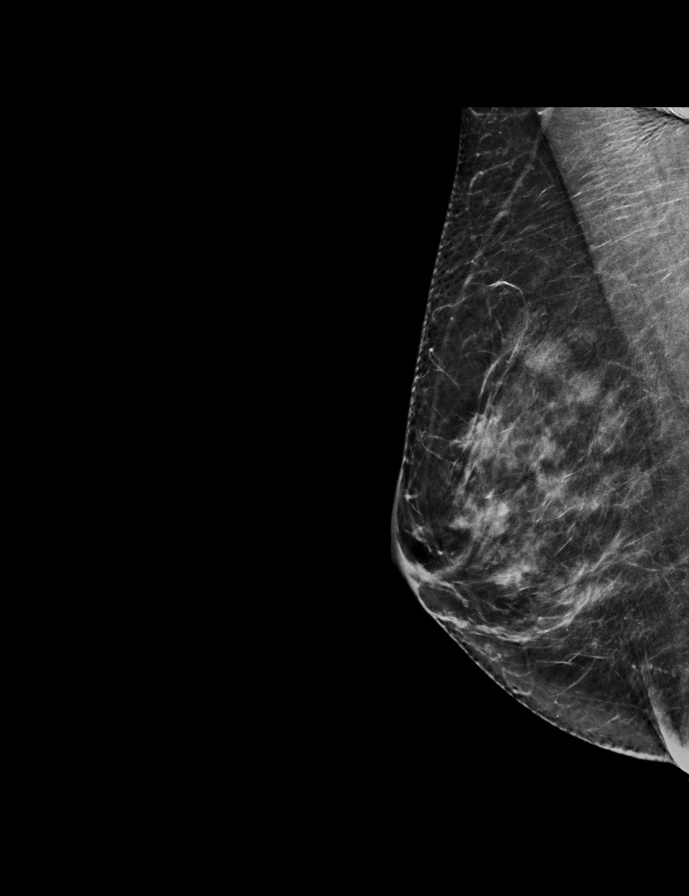

[R CC synth-2D]
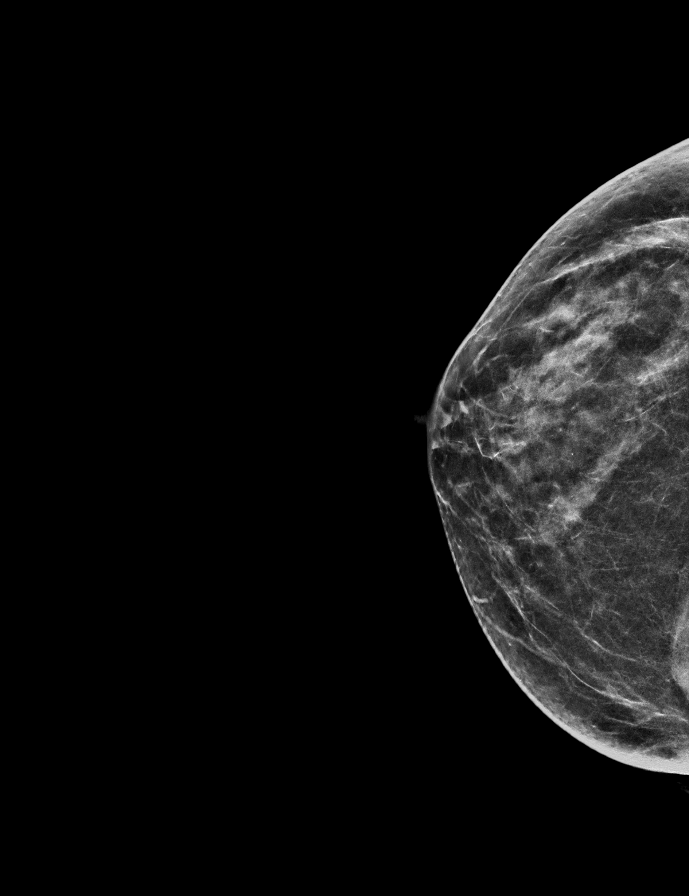

[L CC tomo · 2 of 57 frames shown]
[frame 19/57]
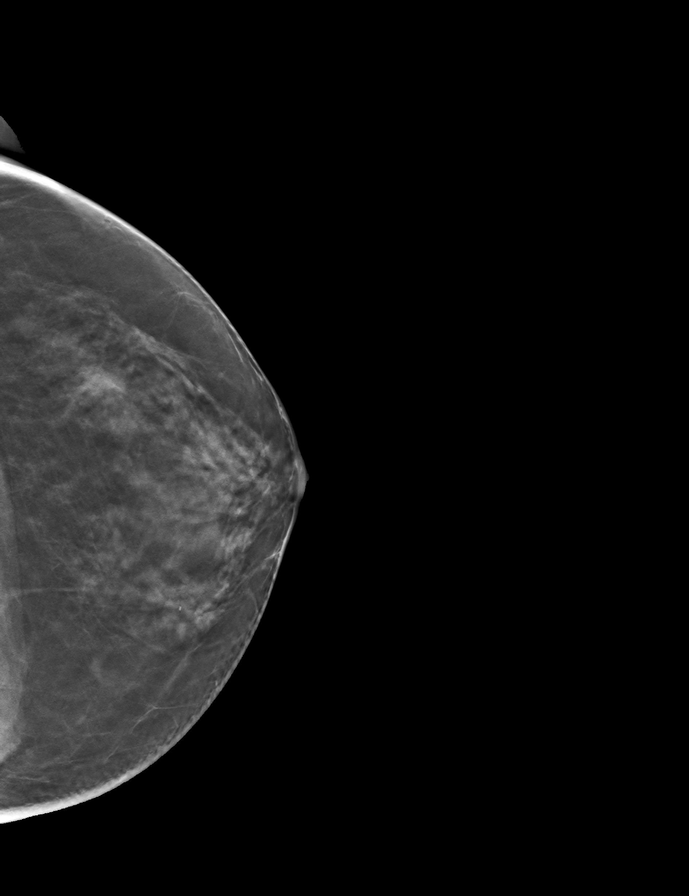
[frame 29/57]
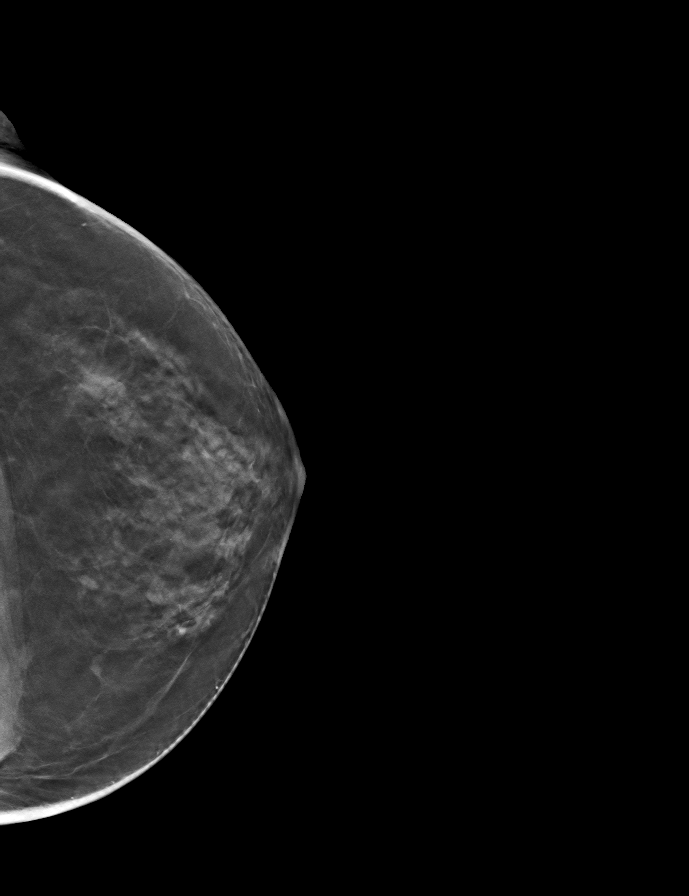

[R MLO tomo · tomo slice 31/62.0]
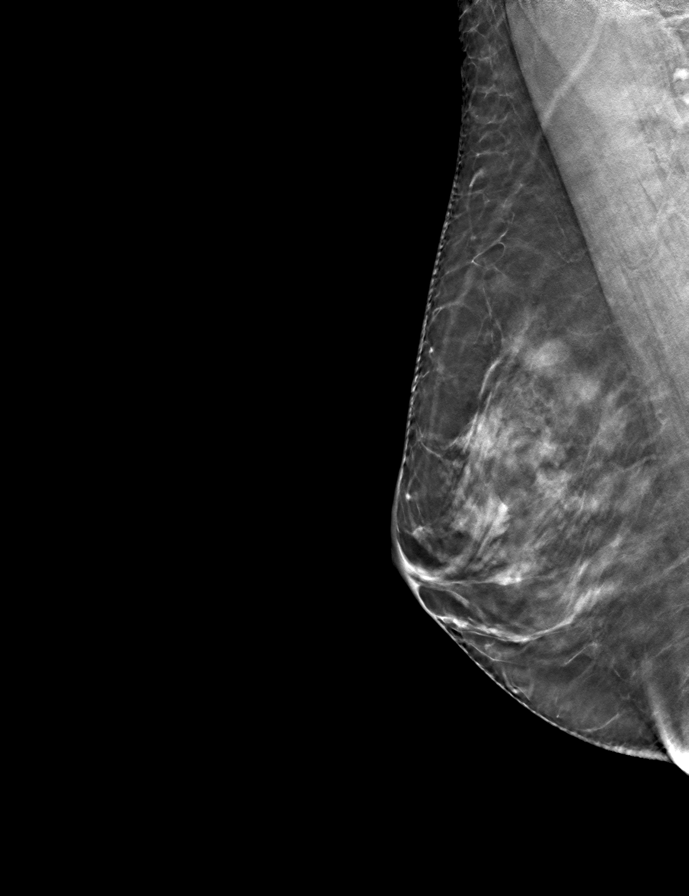

[L MLO tomo · tomo slice 27/53.0]
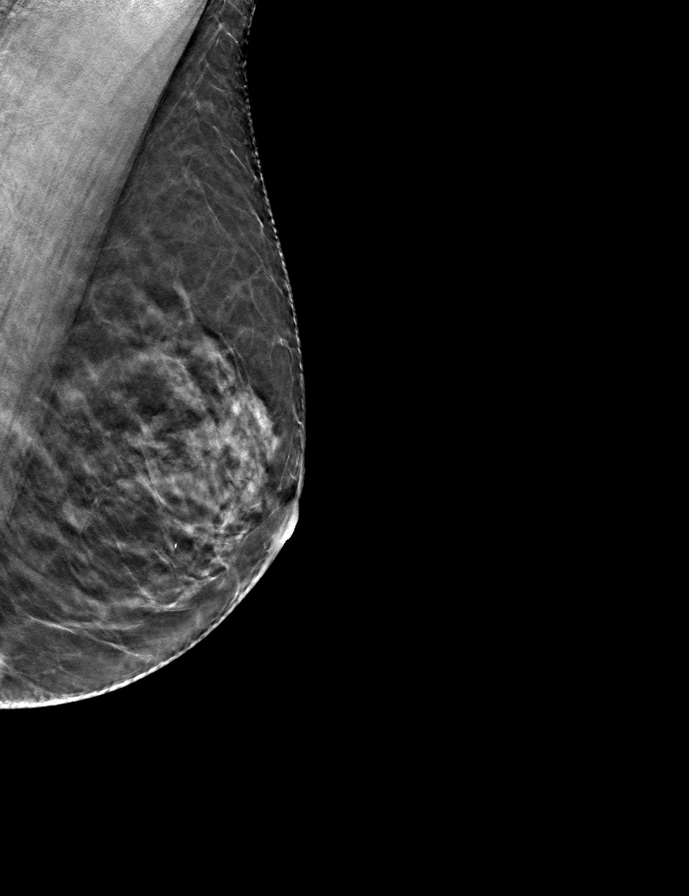

[R CC tomo · tomo slice 28/55.0]
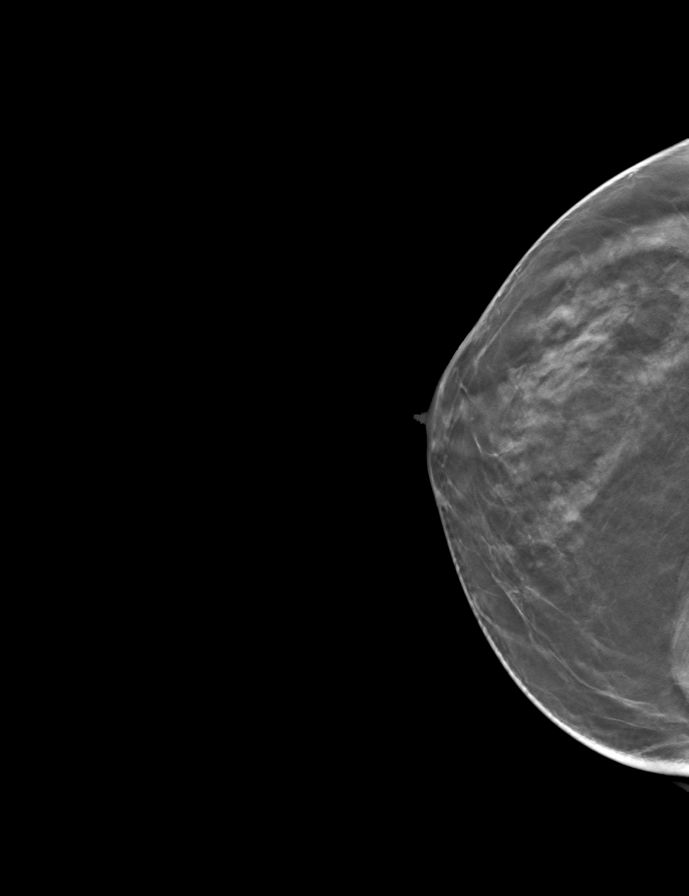

[9 of 24 positions shown; findings below may reference images not displayed]

ACR Breast Density Category b: There are scattered areas of
fibroglandular density.
FINDINGS: There are no findings suspicious for malignancy. Images were
processed with CAD.
IMPRESSION: No mammographic evidence of malignancy. A result letter of this
screening mammogram will be mailed directly to the patient.

RECOMMENDATION:
Screening mammogram in one year. (Code:[id number])

BI-RADS CATEGORY  1: Negative.

## 2020-03-12 DIAGNOSIS — J301 Allergic rhinitis due to pollen: Secondary | ICD-10-CM | POA: Diagnosis not present

## 2020-03-12 DIAGNOSIS — J3081 Allergic rhinitis due to animal (cat) (dog) hair and dander: Secondary | ICD-10-CM | POA: Diagnosis not present

## 2020-03-17 DIAGNOSIS — J301 Allergic rhinitis due to pollen: Secondary | ICD-10-CM | POA: Diagnosis not present

## 2020-03-17 DIAGNOSIS — J3089 Other allergic rhinitis: Secondary | ICD-10-CM | POA: Diagnosis not present

## 2020-03-17 DIAGNOSIS — J3081 Allergic rhinitis due to animal (cat) (dog) hair and dander: Secondary | ICD-10-CM | POA: Diagnosis not present

## 2020-03-24 DIAGNOSIS — J3081 Allergic rhinitis due to animal (cat) (dog) hair and dander: Secondary | ICD-10-CM | POA: Diagnosis not present

## 2020-03-24 DIAGNOSIS — J3089 Other allergic rhinitis: Secondary | ICD-10-CM | POA: Diagnosis not present

## 2020-03-24 DIAGNOSIS — J301 Allergic rhinitis due to pollen: Secondary | ICD-10-CM | POA: Diagnosis not present

## 2020-04-04 DIAGNOSIS — J301 Allergic rhinitis due to pollen: Secondary | ICD-10-CM | POA: Diagnosis not present

## 2020-04-04 DIAGNOSIS — J3081 Allergic rhinitis due to animal (cat) (dog) hair and dander: Secondary | ICD-10-CM | POA: Diagnosis not present

## 2020-04-04 DIAGNOSIS — J3089 Other allergic rhinitis: Secondary | ICD-10-CM | POA: Diagnosis not present

## 2020-04-07 DIAGNOSIS — T63461D Toxic effect of venom of wasps, accidental (unintentional), subsequent encounter: Secondary | ICD-10-CM | POA: Diagnosis not present

## 2020-04-07 DIAGNOSIS — T63451D Toxic effect of venom of hornets, accidental (unintentional), subsequent encounter: Secondary | ICD-10-CM | POA: Diagnosis not present

## 2020-04-07 DIAGNOSIS — T63441D Toxic effect of venom of bees, accidental (unintentional), subsequent encounter: Secondary | ICD-10-CM | POA: Diagnosis not present

## 2020-04-14 DIAGNOSIS — T63461D Toxic effect of venom of wasps, accidental (unintentional), subsequent encounter: Secondary | ICD-10-CM | POA: Diagnosis not present

## 2020-04-14 DIAGNOSIS — T63451D Toxic effect of venom of hornets, accidental (unintentional), subsequent encounter: Secondary | ICD-10-CM | POA: Diagnosis not present

## 2020-04-14 DIAGNOSIS — T63441D Toxic effect of venom of bees, accidental (unintentional), subsequent encounter: Secondary | ICD-10-CM | POA: Diagnosis not present

## 2020-04-16 DIAGNOSIS — J301 Allergic rhinitis due to pollen: Secondary | ICD-10-CM | POA: Diagnosis not present

## 2020-04-16 DIAGNOSIS — J3081 Allergic rhinitis due to animal (cat) (dog) hair and dander: Secondary | ICD-10-CM | POA: Diagnosis not present

## 2020-04-16 DIAGNOSIS — J3089 Other allergic rhinitis: Secondary | ICD-10-CM | POA: Diagnosis not present

## 2020-04-21 DIAGNOSIS — T63461D Toxic effect of venom of wasps, accidental (unintentional), subsequent encounter: Secondary | ICD-10-CM | POA: Diagnosis not present

## 2020-04-21 DIAGNOSIS — T63451D Toxic effect of venom of hornets, accidental (unintentional), subsequent encounter: Secondary | ICD-10-CM | POA: Diagnosis not present

## 2020-04-21 DIAGNOSIS — T63441D Toxic effect of venom of bees, accidental (unintentional), subsequent encounter: Secondary | ICD-10-CM | POA: Diagnosis not present

## 2020-04-28 DIAGNOSIS — T63451D Toxic effect of venom of hornets, accidental (unintentional), subsequent encounter: Secondary | ICD-10-CM | POA: Diagnosis not present

## 2020-04-28 DIAGNOSIS — T63461D Toxic effect of venom of wasps, accidental (unintentional), subsequent encounter: Secondary | ICD-10-CM | POA: Diagnosis not present

## 2020-05-05 DIAGNOSIS — J301 Allergic rhinitis due to pollen: Secondary | ICD-10-CM | POA: Diagnosis not present

## 2020-05-05 DIAGNOSIS — J3081 Allergic rhinitis due to animal (cat) (dog) hair and dander: Secondary | ICD-10-CM | POA: Diagnosis not present

## 2020-05-05 DIAGNOSIS — J3089 Other allergic rhinitis: Secondary | ICD-10-CM | POA: Diagnosis not present

## 2020-05-19 DIAGNOSIS — J3089 Other allergic rhinitis: Secondary | ICD-10-CM | POA: Diagnosis not present

## 2020-05-19 DIAGNOSIS — J3081 Allergic rhinitis due to animal (cat) (dog) hair and dander: Secondary | ICD-10-CM | POA: Diagnosis not present

## 2020-05-19 DIAGNOSIS — J301 Allergic rhinitis due to pollen: Secondary | ICD-10-CM | POA: Diagnosis not present

## 2020-05-27 ENCOUNTER — Telehealth: Payer: Self-pay

## 2020-05-27 NOTE — Telephone Encounter (Signed)
Patient needs Valtrex and said she thought it was sent at visit. It was sent at visit and I called pharmacy to confirm. The pharmacy has the Rx and will go ahead and fill it again and have it ready for her. Patient informed.

## 2020-05-30 DIAGNOSIS — J3081 Allergic rhinitis due to animal (cat) (dog) hair and dander: Secondary | ICD-10-CM | POA: Diagnosis not present

## 2020-05-30 DIAGNOSIS — J3089 Other allergic rhinitis: Secondary | ICD-10-CM | POA: Diagnosis not present

## 2020-05-30 DIAGNOSIS — J301 Allergic rhinitis due to pollen: Secondary | ICD-10-CM | POA: Diagnosis not present

## 2020-06-02 DIAGNOSIS — T63441D Toxic effect of venom of bees, accidental (unintentional), subsequent encounter: Secondary | ICD-10-CM | POA: Diagnosis not present

## 2020-06-02 DIAGNOSIS — T63461D Toxic effect of venom of wasps, accidental (unintentional), subsequent encounter: Secondary | ICD-10-CM | POA: Diagnosis not present

## 2020-06-02 DIAGNOSIS — T63451D Toxic effect of venom of hornets, accidental (unintentional), subsequent encounter: Secondary | ICD-10-CM | POA: Diagnosis not present

## 2020-06-02 DIAGNOSIS — N951 Menopausal and female climacteric states: Secondary | ICD-10-CM | POA: Diagnosis not present

## 2020-06-02 DIAGNOSIS — R5383 Other fatigue: Secondary | ICD-10-CM | POA: Diagnosis not present

## 2020-06-06 DIAGNOSIS — J3081 Allergic rhinitis due to animal (cat) (dog) hair and dander: Secondary | ICD-10-CM | POA: Diagnosis not present

## 2020-06-06 DIAGNOSIS — J301 Allergic rhinitis due to pollen: Secondary | ICD-10-CM | POA: Diagnosis not present

## 2020-06-06 DIAGNOSIS — J3089 Other allergic rhinitis: Secondary | ICD-10-CM | POA: Diagnosis not present

## 2020-06-11 ENCOUNTER — Encounter: Payer: Self-pay | Admitting: Nurse Practitioner

## 2020-06-11 ENCOUNTER — Other Ambulatory Visit: Payer: Self-pay

## 2020-06-11 ENCOUNTER — Ambulatory Visit: Payer: BC Managed Care – PPO | Admitting: Nurse Practitioner

## 2020-06-11 VITALS — BP 138/76 | HR 76 | Resp 18

## 2020-06-11 DIAGNOSIS — B9689 Other specified bacterial agents as the cause of diseases classified elsewhere: Secondary | ICD-10-CM

## 2020-06-11 DIAGNOSIS — N898 Other specified noninflammatory disorders of vagina: Secondary | ICD-10-CM

## 2020-06-11 DIAGNOSIS — N760 Acute vaginitis: Secondary | ICD-10-CM | POA: Diagnosis not present

## 2020-06-11 LAB — WET PREP FOR TRICH, YEAST, CLUE

## 2020-06-11 MED ORDER — METRONIDAZOLE 500 MG PO TABS: 500.0000 mg | ORAL_TABLET | Freq: Two times a day (BID) | ORAL | 0 refills | Status: AC

## 2020-06-11 NOTE — Progress Notes (Signed)
   Acute Office Visit  Subjective:    Patient ID: [name], female    DOB: Jun 07, 1955, 65 y.o.   MRN: [id number]   HPI 65 y.o. presents today for vaginal itching/burning, discharge, and odor that started 1 week ago. She has tried epsom salt soaks and took one dose of Diflucan 5 days ago with some improvement but symptoms persist.    Review of Systems  Constitutional: Negative.   Genitourinary: Positive for vaginal discharge and vaginal pain (itching/burning). Negative for dysuria, frequency and urgency.       Objective:    Physical Exam Constitutional:      Appearance: Normal appearance.  Genitourinary:    General: Normal vulva.     Vagina: Vaginal discharge and erythema present.     BP 138/76 (BP Location: Left Arm, Patient Position: Sitting)   Pulse 76   Resp 18   LMP 02/06/2018 (Exact Date)  Wt Readings from Last 3 Encounters:  12/24/19 142 lb (64.4 kg)  12/18/18 152 lb (68.9 kg)  03/09/18 147 lb (66.7 kg)   Wet prep + clue cells     Assessment & Plan:   Problem List Items Addressed This Visit   None   Visit Diagnoses    Bacterial vaginosis    -  Primary   Relevant Medications   metroNIDAZOLE (FLAGYL) 500 MG tablet   Vaginal itching       Relevant Orders   WET PREP FOR TRICH, YEAST, CLUE     Plan: Wet prep positive for clue cells. Flagyl 500 mg twice daily x 7 days. Instructed to take with food and avoid alcohol. If symptoms worsen or do not improve she will return to office. She is agreeable to plan.      Tamela Gammon [facility], 2:45 PM 06/11/2020

## 2020-06-11 NOTE — Patient Instructions (Signed)
Bacterial Vaginosis  Bacterial vaginosis is an infection that occurs when the normal balance of bacteria in the vagina changes. This change is caused by an overgrowth of certain bacteria in the vagina. Bacterial vaginosis is the most common vaginal infection among females aged 63 to 101 years. This condition increases the risk of sexually transmitted infections (STIs). Treatment can help reduce this risk. Treatment is very important for pregnant women because this condition can cause babies to be born early (prematurely) or at a low birth weight. What are the causes? This condition is caused by an increase in harmful bacteria that are normally present in small amounts in the vagina. However, the exact reason this condition develops is not known. You cannot get bacterial vaginosis from toilet seats, bedding, swimming pools, or contact with objects around you. What increases the risk? The following factors may make you more likely to develop this condition:  Having a new sexual partner or multiple sexual partners, or having unprotected sex.  Douching.  Having an intrauterine device (IUD).  Smoking.  Abusing drugs and alcohol. This may lead to riskier sexual behavior.  Taking certain antibiotic medicines.  Being pregnant. What are the signs or symptoms? Some women with this condition have no symptoms. Symptoms may include:  Pearline Cables or white vaginal discharge. The discharge can be watery or foamy.  A fish-like odor with discharge, especially after sex or during menstruation.  Itching in and around the vagina.  Burning or pain with urination. How is this diagnosed? This condition is diagnosed based on:  Your medical history.  A physical exam of the vagina.  Checking a sample of vaginal fluid for harmful bacteria or abnormal cells. How is this treated? This condition is treated with antibiotic medicines. These may be given as a pill, a vaginal cream, or a medicine that is put into the  vagina (suppository). If the condition comes back after treatment, a second round of antibiotics may be needed. Follow these instructions at home: Medicines  Take or apply over-the-counter and prescription medicines only as told by your health care provider.  Take or apply your antibiotic medicine as told by your health care provider. Do not stop using the antibiotic even if you start to feel better. General instructions  If you have a female sexual partner, tell her that you have a vaginal infection. She should follow up with her health care provider. If you have a female sexual partner, he does not need treatment.  Avoid sexual activity until you finish treatment.  Drink enough fluid to keep your urine pale yellow.  Keep the area around your vagina and rectum clean. ? Wash the area daily with warm water. ? Wipe yourself from front to back after using the toilet.  If you are breastfeeding, talk to your health care provider about continuing breastfeeding during treatment.  Keep all follow-up visits. This is important. How is this prevented? Self-care  Do not douche.  Wash the outside of your vagina with warm water only.  Wear cotton or cotton-lined underwear.  Avoid wearing tight pants and pantyhose, especially during the summer. Safe sex  Use protection when having sex. This includes: ? Using condoms. ? Using dental dams. This is a thin layer of a material made of latex or polyurethane that protects the mouth during oral sex.  Limit the number of sexual partners. To help prevent bacterial vaginosis, it is best to have sex with just one partner (monogamous relationship).  Make sure you and your sexual partner  are tested for STIs. Drugs and alcohol  Do not use any products that contain nicotine or tobacco. These products include cigarettes, chewing tobacco, and vaping devices, such as e-cigarettes. If you need help quitting, ask your health care provider.  Do not use  drugs.  Do not drink alcohol if: ? Your health care provider tells you not to do this. ? You are pregnant, may be pregnant, or are planning to become pregnant.  If you drink alcohol: ? Limit how much you have to 0-1 drink a day. ? Be aware of how much alcohol is in your drink. In the [place] one drink equals one 12 oz bottle of beer (355 mL), one 5 oz glass of wine (148 mL), or one 1 oz glass of hard liquor (44 mL). Where to find more information  Centers for Disease Control and Prevention: http://www.wolf.info/  American Sexual Health Association (ASHA): www.ashastd.org  [place] Department of Health and Financial controller, Office on Women's Health: [place].tn Contact a health care provider if:  Your symptoms do not improve, even after treatment.  You have more discharge or pain when urinating.  You have a fever or chills.  You have pain in your abdomen or pelvis.  You have pain during sex.  You have vaginal bleeding between menstrual periods. Summary  Bacterial vaginosis is a vaginal infection that occurs when the normal balance of bacteria in the vagina changes. It results from an overgrowth of certain bacteria.  This condition increases the risk of sexually transmitted infections (STIs). Getting treated can help reduce this risk.  Treatment is very important for pregnant women because this condition can cause babies to be born early (prematurely) or at low birth weight.  This condition is treated with antibiotic medicines. These may be given as a pill, a vaginal cream, or a medicine that is put into the vagina (suppository). This information is not intended to replace advice given to you by your health care provider. Make sure you discuss any questions you have with your health care provider. Document Revised: 11/08/2019 Document Reviewed: 11/08/2019 Elsevier Patient Education  [place].

## 2020-06-12 DIAGNOSIS — R232 Flushing: Secondary | ICD-10-CM | POA: Diagnosis not present

## 2020-06-12 DIAGNOSIS — N951 Menopausal and female climacteric states: Secondary | ICD-10-CM | POA: Diagnosis not present

## 2020-06-12 DIAGNOSIS — N898 Other specified noninflammatory disorders of vagina: Secondary | ICD-10-CM | POA: Diagnosis not present

## 2020-06-12 DIAGNOSIS — Z6822 Body mass index (BMI) 22.0-22.9, adult: Secondary | ICD-10-CM | POA: Diagnosis not present

## 2020-06-19 ENCOUNTER — Telehealth: Payer: Self-pay

## 2020-06-19 NOTE — Telephone Encounter (Signed)
Irritation may be residual irritation from the infection. I would expect this to improve over the next week. If not, please ask her to let me know if she is still having symptoms at the time.

## 2020-06-19 NOTE — Telephone Encounter (Signed)
Spoke with patient and informed her. °

## 2020-06-19 NOTE — Telephone Encounter (Signed)
Patient took Metronidazole as prescribed and finished it. She said sx almost went away completely but she still feels the slightest bit of irritation at times.  She said she just expected not to feel anything after finishing medication.   I explained Jonelle Sidle is out of the office this week but has been checking in on her inbox each day so far so I would go ahead and route to her.

## 2020-06-23 DIAGNOSIS — J3089 Other allergic rhinitis: Secondary | ICD-10-CM | POA: Diagnosis not present

## 2020-06-23 DIAGNOSIS — J301 Allergic rhinitis due to pollen: Secondary | ICD-10-CM | POA: Diagnosis not present

## 2020-06-23 DIAGNOSIS — J3081 Allergic rhinitis due to animal (cat) (dog) hair and dander: Secondary | ICD-10-CM | POA: Diagnosis not present

## 2020-06-26 DIAGNOSIS — Z03818 Encounter for observation for suspected exposure to other biological agents ruled out: Secondary | ICD-10-CM | POA: Diagnosis not present

## 2020-06-26 DIAGNOSIS — Z20822 Contact with and (suspected) exposure to covid-19: Secondary | ICD-10-CM | POA: Diagnosis not present

## 2020-07-03 DIAGNOSIS — Z20822 Contact with and (suspected) exposure to covid-19: Secondary | ICD-10-CM | POA: Diagnosis not present

## 2020-07-03 DIAGNOSIS — Z03818 Encounter for observation for suspected exposure to other biological agents ruled out: Secondary | ICD-10-CM | POA: Diagnosis not present

## 2020-07-04 DIAGNOSIS — J3081 Allergic rhinitis due to animal (cat) (dog) hair and dander: Secondary | ICD-10-CM | POA: Diagnosis not present

## 2020-07-04 DIAGNOSIS — J301 Allergic rhinitis due to pollen: Secondary | ICD-10-CM | POA: Diagnosis not present

## 2020-07-04 DIAGNOSIS — J3089 Other allergic rhinitis: Secondary | ICD-10-CM | POA: Diagnosis not present

## 2020-07-07 DIAGNOSIS — J3089 Other allergic rhinitis: Secondary | ICD-10-CM | POA: Diagnosis not present

## 2020-07-07 DIAGNOSIS — J301 Allergic rhinitis due to pollen: Secondary | ICD-10-CM | POA: Diagnosis not present

## 2020-07-07 DIAGNOSIS — J3081 Allergic rhinitis due to animal (cat) (dog) hair and dander: Secondary | ICD-10-CM | POA: Diagnosis not present

## 2020-07-14 DIAGNOSIS — J3089 Other allergic rhinitis: Secondary | ICD-10-CM | POA: Diagnosis not present

## 2020-07-14 DIAGNOSIS — J301 Allergic rhinitis due to pollen: Secondary | ICD-10-CM | POA: Diagnosis not present

## 2020-07-14 DIAGNOSIS — J3081 Allergic rhinitis due to animal (cat) (dog) hair and dander: Secondary | ICD-10-CM | POA: Diagnosis not present

## 2020-07-21 DIAGNOSIS — J301 Allergic rhinitis due to pollen: Secondary | ICD-10-CM | POA: Diagnosis not present

## 2020-07-21 DIAGNOSIS — J3089 Other allergic rhinitis: Secondary | ICD-10-CM | POA: Diagnosis not present

## 2020-07-21 DIAGNOSIS — J3081 Allergic rhinitis due to animal (cat) (dog) hair and dander: Secondary | ICD-10-CM | POA: Diagnosis not present

## 2020-07-28 DIAGNOSIS — J3089 Other allergic rhinitis: Secondary | ICD-10-CM | POA: Diagnosis not present

## 2020-07-28 DIAGNOSIS — J301 Allergic rhinitis due to pollen: Secondary | ICD-10-CM | POA: Diagnosis not present

## 2020-07-28 DIAGNOSIS — J3081 Allergic rhinitis due to animal (cat) (dog) hair and dander: Secondary | ICD-10-CM | POA: Diagnosis not present

## 2020-08-04 DIAGNOSIS — J3089 Other allergic rhinitis: Secondary | ICD-10-CM | POA: Diagnosis not present

## 2020-08-04 DIAGNOSIS — J3081 Allergic rhinitis due to animal (cat) (dog) hair and dander: Secondary | ICD-10-CM | POA: Diagnosis not present

## 2020-08-04 DIAGNOSIS — J301 Allergic rhinitis due to pollen: Secondary | ICD-10-CM | POA: Diagnosis not present

## 2020-08-08 DIAGNOSIS — J3081 Allergic rhinitis due to animal (cat) (dog) hair and dander: Secondary | ICD-10-CM | POA: Diagnosis not present

## 2020-08-08 DIAGNOSIS — J301 Allergic rhinitis due to pollen: Secondary | ICD-10-CM | POA: Diagnosis not present

## 2020-08-08 DIAGNOSIS — J3089 Other allergic rhinitis: Secondary | ICD-10-CM | POA: Diagnosis not present

## 2020-08-14 DIAGNOSIS — J3081 Allergic rhinitis due to animal (cat) (dog) hair and dander: Secondary | ICD-10-CM | POA: Diagnosis not present

## 2020-08-14 DIAGNOSIS — J301 Allergic rhinitis due to pollen: Secondary | ICD-10-CM | POA: Diagnosis not present

## 2020-08-14 DIAGNOSIS — J3089 Other allergic rhinitis: Secondary | ICD-10-CM | POA: Diagnosis not present

## 2020-08-18 DIAGNOSIS — J3089 Other allergic rhinitis: Secondary | ICD-10-CM | POA: Diagnosis not present

## 2020-08-18 DIAGNOSIS — J301 Allergic rhinitis due to pollen: Secondary | ICD-10-CM | POA: Diagnosis not present

## 2020-08-18 DIAGNOSIS — J3081 Allergic rhinitis due to animal (cat) (dog) hair and dander: Secondary | ICD-10-CM | POA: Diagnosis not present

## 2020-08-20 ENCOUNTER — Other Ambulatory Visit: Payer: Self-pay | Admitting: Physician Assistant

## 2020-08-20 ENCOUNTER — Other Ambulatory Visit (HOSPITAL_COMMUNITY): Payer: Self-pay | Admitting: Physician Assistant

## 2020-08-20 DIAGNOSIS — F17210 Nicotine dependence, cigarettes, uncomplicated: Secondary | ICD-10-CM

## 2020-08-20 DIAGNOSIS — Z0000 Encounter for general adult medical examination without abnormal findings: Secondary | ICD-10-CM | POA: Diagnosis not present

## 2020-08-20 DIAGNOSIS — J341 Cyst and mucocele of nose and nasal sinus: Secondary | ICD-10-CM

## 2020-08-20 DIAGNOSIS — Z1331 Encounter for screening for depression: Secondary | ICD-10-CM | POA: Diagnosis not present

## 2020-08-20 DIAGNOSIS — R928 Other abnormal and inconclusive findings on diagnostic imaging of breast: Secondary | ICD-10-CM

## 2020-08-20 DIAGNOSIS — Z1389 Encounter for screening for other disorder: Secondary | ICD-10-CM | POA: Diagnosis not present

## 2020-08-20 DIAGNOSIS — F17200 Nicotine dependence, unspecified, uncomplicated: Secondary | ICD-10-CM | POA: Diagnosis not present

## 2020-08-20 DIAGNOSIS — Z0001 Encounter for general adult medical examination with abnormal findings: Secondary | ICD-10-CM | POA: Diagnosis not present

## 2020-08-20 DIAGNOSIS — Z6823 Body mass index (BMI) 23.0-23.9, adult: Secondary | ICD-10-CM | POA: Diagnosis not present

## 2020-08-22 DIAGNOSIS — U071 COVID-19: Secondary | ICD-10-CM

## 2020-08-22 HISTORY — DX: COVID-19: U07.1

## 2020-08-28 ENCOUNTER — Other Ambulatory Visit: Payer: Self-pay

## 2020-08-28 ENCOUNTER — Ambulatory Visit (HOSPITAL_COMMUNITY)
Admission: RE | Admit: 2020-08-28 | Discharge: 2020-08-28 | Disposition: A | Discharge status: Home / Self Care | Payer: BC Managed Care – PPO | Source: Ambulatory Visit | Attending: Physician Assistant | Admitting: Physician Assistant

## 2020-08-28 DIAGNOSIS — R922 Inconclusive mammogram: Secondary | ICD-10-CM | POA: Diagnosis not present

## 2020-08-28 DIAGNOSIS — R928 Other abnormal and inconclusive findings on diagnostic imaging of breast: Secondary | ICD-10-CM | POA: Insufficient documentation

## 2020-08-28 DIAGNOSIS — R921 Mammographic calcification found on diagnostic imaging of breast: Secondary | ICD-10-CM | POA: Diagnosis not present

## 2020-08-29 DIAGNOSIS — J301 Allergic rhinitis due to pollen: Secondary | ICD-10-CM | POA: Diagnosis not present

## 2020-08-29 DIAGNOSIS — J3089 Other allergic rhinitis: Secondary | ICD-10-CM | POA: Diagnosis not present

## 2020-08-29 DIAGNOSIS — J3081 Allergic rhinitis due to animal (cat) (dog) hair and dander: Secondary | ICD-10-CM | POA: Diagnosis not present

## 2020-08-30 DIAGNOSIS — J3081 Allergic rhinitis due to animal (cat) (dog) hair and dander: Secondary | ICD-10-CM | POA: Diagnosis not present

## 2020-08-30 DIAGNOSIS — J3089 Other allergic rhinitis: Secondary | ICD-10-CM | POA: Diagnosis not present

## 2020-08-30 DIAGNOSIS — J301 Allergic rhinitis due to pollen: Secondary | ICD-10-CM | POA: Diagnosis not present

## 2020-09-01 DIAGNOSIS — R5383 Other fatigue: Secondary | ICD-10-CM | POA: Diagnosis not present

## 2020-09-01 DIAGNOSIS — N951 Menopausal and female climacteric states: Secondary | ICD-10-CM | POA: Diagnosis not present

## 2020-09-01 DIAGNOSIS — J3081 Allergic rhinitis due to animal (cat) (dog) hair and dander: Secondary | ICD-10-CM | POA: Diagnosis not present

## 2020-09-01 DIAGNOSIS — J301 Allergic rhinitis due to pollen: Secondary | ICD-10-CM | POA: Diagnosis not present

## 2020-09-01 DIAGNOSIS — J3089 Other allergic rhinitis: Secondary | ICD-10-CM | POA: Diagnosis not present

## 2020-09-05 DIAGNOSIS — R5383 Other fatigue: Secondary | ICD-10-CM | POA: Diagnosis not present

## 2020-09-05 DIAGNOSIS — N951 Menopausal and female climacteric states: Secondary | ICD-10-CM | POA: Diagnosis not present

## 2020-09-05 DIAGNOSIS — R232 Flushing: Secondary | ICD-10-CM | POA: Diagnosis not present

## 2020-09-08 DIAGNOSIS — J3089 Other allergic rhinitis: Secondary | ICD-10-CM | POA: Diagnosis not present

## 2020-09-08 DIAGNOSIS — J3081 Allergic rhinitis due to animal (cat) (dog) hair and dander: Secondary | ICD-10-CM | POA: Diagnosis not present

## 2020-09-08 DIAGNOSIS — J301 Allergic rhinitis due to pollen: Secondary | ICD-10-CM | POA: Diagnosis not present

## 2020-09-12 DIAGNOSIS — J019 Acute sinusitis, unspecified: Secondary | ICD-10-CM | POA: Diagnosis not present

## 2020-09-16 ENCOUNTER — Encounter (HOSPITAL_COMMUNITY): Payer: BC Managed Care – PPO

## 2020-09-16 ENCOUNTER — Ambulatory Visit (HOSPITAL_COMMUNITY): Payer: BC Managed Care – PPO

## 2020-09-21 ENCOUNTER — Ambulatory Visit
Admission: RE | Admit: 2020-09-21 | Discharge: 2020-09-21 | Disposition: A | Discharge status: Home / Self Care | Payer: BC Managed Care – PPO | Source: Ambulatory Visit

## 2020-09-21 ENCOUNTER — Other Ambulatory Visit: Payer: Self-pay

## 2020-09-21 DIAGNOSIS — B349 Viral infection, unspecified: Secondary | ICD-10-CM | POA: Diagnosis not present

## 2020-09-21 DIAGNOSIS — J069 Acute upper respiratory infection, unspecified: Secondary | ICD-10-CM | POA: Diagnosis not present

## 2020-09-21 NOTE — ED Provider Notes (Signed)
[facility]    CSN: [id number] Arrival date & time: 09/21/20  1342      History   Chief Complaint Chief Complaint  Patient presents with  . appointment    1400    HPI [name] is a 65 y.o. female.   HPI Patient presents with URI symptoms including cough, sore throat, body aches  nasal congestion, runny nose, and sinus pressure.  Denies worrisome symptoms of shortness of breath, weakness, N&V, chest pain. No known COVID exposure. Fever today, 100.5 AM.  Past Medical History:  Diagnosis Date  . Abdominal pain, epigastric   . Abnormal Pap smear of cervix    11-01-14 LGSIL  . Arthritis    in back  . Asthma    as a child  . Constipation   . History of Salmonella infection   . HSV infection   . Irritable bowel syndrome   . Melanoma of skin, site unspecified   . Rotator cuff syndrome of left shoulder   . Seasonal allergies   . Shoulder pain     Patient Active Problem List   Diagnosis Date Noted  . History of Salmonella infection 07/10/2009  . MELANOMA OF SKIN, SITE UNSPECIFIED 07/10/2009  . CONSTIPATION 07/10/2009  . ABDOMINAL PAIN, EPIGASTRIC 07/10/2009  . IRRITABLE BOWEL SYNDROME, HX OF 07/10/2009  . SHOULDER PAIN 06/07/2007  . ROTATOR CUFF SYNDROME, LEFT 06/07/2007    Past Surgical History:  Procedure Laterality Date  . COLPOSCOPY  2016  . DILATION AND CURETTAGE OF UTERUS  1997   bleeding  . MOLE REMOVAL     cancer  . SKIN GRAFT    . Thermal Ablation     of the uterus  . TUBAL LIGATION      OB History    Gravida  0   Para  0   Term  0   Preterm  0   AB  0   Living  0     SAB  0   IAB  0   Ectopic  0   Multiple  0   Live Births               Home Medications    Prior to Admission medications   Medication Sig Start Date End Date Taking? Authorizing Provider  AMOXICILLIN PO Take by mouth.   Yes [provider]  Cyanocobalamin (VITAMIN B-12) 2500 MCG SUBL Place 5,000 mcg under the tongue daily.   Yes  [provider]  fluticasone (FLONASE) 50 MCG/ACT nasal spray daily. 02/23/18  Yes [provider]  levocetirizine (XYZAL) 5 MG tablet at bedtime. 02/23/18  Yes [provider]  montelukast (SINGULAIR) 10 MG tablet as needed.  10/28/14  Yes [provider]  progesterone (PROMETRIUM) 100 MG capsule  10/10/17  Yes [provider]  UNABLE TO FIND Allergy shots   Yes [provider]  UNABLE TO FIND Estrogen/testosterone pellet   Yes [provider]  valACYclovir (VALTREX) 500 MG tablet Take 1 tablet (500 mg total) by mouth 2 (two) times daily. Take for 3 days as needed. 12/24/19  Yes Nunzio Cobbs, MD  EPINEPHrine 0.3 mg/0.3 mL IJ SOAJ injection Inject into the muscle. 11/28/19   [provider]    Family History Family History  Problem Relation Age of Onset  . Leukemia Brother   . Hypertension Mother   . Heart attack Mother   . Fibroids Mother        h/x  hysterectomy  . Hypothyroidism Mother   . Diabetes Mother   . Acromegaly Mother   . Hypertension Father   . Kidney Stones Father   . Diabetes Father   . Heart block Father        stint put in  . Diabetes Maternal Grandmother   . Heart failure Maternal Grandmother   . Depression Maternal Grandfather   . Parkinson's disease Maternal Grandfather   . Cancer Maternal Grandfather        brain  . Breast cancer Paternal Aunt   . Breast cancer Paternal Aunt     Social History Social History   Tobacco Use  . Smoking status: Current Every Day Smoker    Packs/day: 0.50    Types: Cigarettes  . Smokeless tobacco: Never Used  Vaping Use  . Vaping Use: Never used  Substance Use Topics  . Alcohol use: Yes    Alcohol/week: 12.0 standard drinks    Types: 12 Standard drinks or equivalent per week    Comment: "socially"  . Drug use: No     Allergies   Bee venom   Review of Systems Review of Systems Pertinent negatives listed in HPI   Physical  Exam Triage Vital Signs ED Triage Vitals [09/21/20 1447]  Enc Vitals Group     BP      Pulse      Resp      Temp      Temp src      SpO2      Weight      Height      Head Circumference      Peak Flow      Pain Score 0     Pain Loc      Pain Edu?      Excl. in [place]?    No data found.  Updated Vital Signs LMP 02/06/2018 (Exact Date)   Visual Acuity Right Eye Distance:   Left Eye Distance:   Bilateral Distance:    Right Eye Near:   Left Eye Near:    Bilateral Near:     Physical Exam  General Appearance:    Alert, acutely ill appearing, cooperative, no distress  HENT:   Normocephalic, ears normal, nares mucosal edema with congestion, rhinorrhea, oropharynx normal   Eyes:    PERRL, conjunctiva/corneas clear, EOM's intact       Lungs:     Clear to auscultation bilaterally, respirations unlabored  Heart:    Regular rate and rhythm  Neurologic:   Awake, alert, oriented x 3. No apparent focal neurological           defect.      UC Treatments / Results  Labs (all labs ordered are listed, but only abnormal results are displayed) Labs Reviewed  COVID-19, FLU A+B NAA - Abnormal; Notable for the following components:      Result Value   SARS-CoV-2, NAA Detected (*)    All other components within normal limits   Narrative:    Test(s) 140142-Influenza A, NAA; 140143-Influenza B, NAA was developed and its performance characteristics determined by [facility]. It has not been cleared or approved by the Food and Drug Administration. Performed at:  [address], [place], [place]  [id number] Lab Director: Rush Farmer MD, Phone:  [phone]    EKG   Radiology No results found.  Procedures Procedures (including critical care time)  Medications Ordered in UC Medications - No data to display  Initial Impression / Assessment  and Plan / UC Course  I have reviewed the triage vital signs and the nursing notes.  Pertinent labs & imaging results  that were available during my care of the patient were reviewed by me and considered in my medical decision making (see chart for details).    COVID/Flu test pending. Symptom management warranted only.  Manage fever with Tylenol and ibuprofen.  Nasal symptoms with over-the-counter antihistamines recommended.  Treatment per discharge medications/discharge instructions.  Red flags/ER precautions given. The most current CDC isolation/quarantine recommendation advised.   Final Clinical Impressions(s) / UC Diagnoses   Final diagnoses:  Viral illness     Discharge Instructions     Your COVID 19 results will be available in 48 hour no later than 72 hours. Negative results are immediately resulted to Mychart. Positive results will receive a follow-up call from our clinic. If symptoms are present, I recommend home quarantine until results are known.     ED Prescriptions    None     PDMP not reviewed this encounter.   Scot Jun, FNP 09/27/20 1745

## 2020-09-21 NOTE — ED Triage Notes (Signed)
Started steroid and abx (amoxicillin - last dose today) 4/22 for sinus infection, and had been feeling better. Yesterday started to have general malaise, body aches, swollen glands, temp up to 100.5 this AM.  Concerned for Covid.

## 2020-09-21 NOTE — Discharge Instructions (Addendum)
Your COVID 19 results will be available in 48 hour no later than 72 hours. Negative results are immediately resulted to Mychart. Positive results will receive a follow-up call from our clinic. If symptoms are present, I recommend home quarantine until results are known.

## 2020-09-22 ENCOUNTER — Ambulatory Visit (HOSPITAL_COMMUNITY): Admission: RE | Admit: 2020-09-22 | Payer: BC Managed Care – PPO | Source: Ambulatory Visit

## 2020-09-22 ENCOUNTER — Encounter (HOSPITAL_COMMUNITY): Payer: Self-pay

## 2020-09-22 LAB — COVID-19, FLU A+B NAA
Influenza A, NAA: NOT DETECTED
Influenza B, NAA: NOT DETECTED
SARS-CoV-2, NAA: DETECTED — AB

## 2020-10-06 DIAGNOSIS — J3081 Allergic rhinitis due to animal (cat) (dog) hair and dander: Secondary | ICD-10-CM | POA: Diagnosis not present

## 2020-10-06 DIAGNOSIS — J301 Allergic rhinitis due to pollen: Secondary | ICD-10-CM | POA: Diagnosis not present

## 2020-10-06 DIAGNOSIS — J3089 Other allergic rhinitis: Secondary | ICD-10-CM | POA: Diagnosis not present

## 2020-10-10 DIAGNOSIS — T63451D Toxic effect of venom of hornets, accidental (unintentional), subsequent encounter: Secondary | ICD-10-CM | POA: Diagnosis not present

## 2020-10-10 DIAGNOSIS — T63441D Toxic effect of venom of bees, accidental (unintentional), subsequent encounter: Secondary | ICD-10-CM | POA: Diagnosis not present

## 2020-10-10 DIAGNOSIS — T63461D Toxic effect of venom of wasps, accidental (unintentional), subsequent encounter: Secondary | ICD-10-CM | POA: Diagnosis not present

## 2020-10-13 DIAGNOSIS — J3081 Allergic rhinitis due to animal (cat) (dog) hair and dander: Secondary | ICD-10-CM | POA: Diagnosis not present

## 2020-10-13 DIAGNOSIS — J3089 Other allergic rhinitis: Secondary | ICD-10-CM | POA: Diagnosis not present

## 2020-10-13 DIAGNOSIS — J301 Allergic rhinitis due to pollen: Secondary | ICD-10-CM | POA: Diagnosis not present

## 2020-10-21 DIAGNOSIS — J3089 Other allergic rhinitis: Secondary | ICD-10-CM | POA: Diagnosis not present

## 2020-10-21 DIAGNOSIS — J3081 Allergic rhinitis due to animal (cat) (dog) hair and dander: Secondary | ICD-10-CM | POA: Diagnosis not present

## 2020-10-21 DIAGNOSIS — J301 Allergic rhinitis due to pollen: Secondary | ICD-10-CM | POA: Diagnosis not present

## 2020-10-23 ENCOUNTER — Other Ambulatory Visit: Payer: Self-pay

## 2020-10-23 ENCOUNTER — Ambulatory Visit (HOSPITAL_COMMUNITY)
Admission: RE | Admit: 2020-10-23 | Discharge: 2020-10-23 | Disposition: A | Discharge status: Home / Self Care | Payer: BC Managed Care – PPO | Source: Ambulatory Visit | Attending: Physician Assistant | Admitting: Physician Assistant

## 2020-10-23 DIAGNOSIS — J3489 Other specified disorders of nose and nasal sinuses: Secondary | ICD-10-CM | POA: Diagnosis not present

## 2020-10-23 DIAGNOSIS — J341 Cyst and mucocele of nose and nasal sinus: Secondary | ICD-10-CM

## 2020-10-23 DIAGNOSIS — F17210 Nicotine dependence, cigarettes, uncomplicated: Secondary | ICD-10-CM | POA: Insufficient documentation

## 2020-10-23 DIAGNOSIS — J320 Chronic maxillary sinusitis: Secondary | ICD-10-CM | POA: Diagnosis not present

## 2020-10-23 DIAGNOSIS — J323 Chronic sphenoidal sinusitis: Secondary | ICD-10-CM | POA: Diagnosis not present

## 2020-10-23 DIAGNOSIS — J322 Chronic ethmoidal sinusitis: Secondary | ICD-10-CM | POA: Diagnosis not present

## 2020-10-27 DIAGNOSIS — T63441D Toxic effect of venom of bees, accidental (unintentional), subsequent encounter: Secondary | ICD-10-CM | POA: Diagnosis not present

## 2020-10-27 DIAGNOSIS — T63461D Toxic effect of venom of wasps, accidental (unintentional), subsequent encounter: Secondary | ICD-10-CM | POA: Diagnosis not present

## 2020-10-27 DIAGNOSIS — T63451D Toxic effect of venom of hornets, accidental (unintentional), subsequent encounter: Secondary | ICD-10-CM | POA: Diagnosis not present

## 2020-11-03 DIAGNOSIS — T63451D Toxic effect of venom of hornets, accidental (unintentional), subsequent encounter: Secondary | ICD-10-CM | POA: Diagnosis not present

## 2020-11-03 DIAGNOSIS — T63441D Toxic effect of venom of bees, accidental (unintentional), subsequent encounter: Secondary | ICD-10-CM | POA: Diagnosis not present

## 2020-11-03 DIAGNOSIS — T63461D Toxic effect of venom of wasps, accidental (unintentional), subsequent encounter: Secondary | ICD-10-CM | POA: Diagnosis not present

## 2020-11-07 DIAGNOSIS — J301 Allergic rhinitis due to pollen: Secondary | ICD-10-CM | POA: Diagnosis not present

## 2020-11-07 DIAGNOSIS — J3089 Other allergic rhinitis: Secondary | ICD-10-CM | POA: Diagnosis not present

## 2020-11-07 DIAGNOSIS — J3081 Allergic rhinitis due to animal (cat) (dog) hair and dander: Secondary | ICD-10-CM | POA: Diagnosis not present

## 2020-11-10 DIAGNOSIS — T63441D Toxic effect of venom of bees, accidental (unintentional), subsequent encounter: Secondary | ICD-10-CM | POA: Diagnosis not present

## 2020-11-10 DIAGNOSIS — T63461D Toxic effect of venom of wasps, accidental (unintentional), subsequent encounter: Secondary | ICD-10-CM | POA: Diagnosis not present

## 2020-11-10 DIAGNOSIS — T63451D Toxic effect of venom of hornets, accidental (unintentional), subsequent encounter: Secondary | ICD-10-CM | POA: Diagnosis not present

## 2020-11-14 DIAGNOSIS — J301 Allergic rhinitis due to pollen: Secondary | ICD-10-CM | POA: Diagnosis not present

## 2020-11-14 DIAGNOSIS — J3089 Other allergic rhinitis: Secondary | ICD-10-CM | POA: Diagnosis not present

## 2020-11-14 DIAGNOSIS — J3081 Allergic rhinitis due to animal (cat) (dog) hair and dander: Secondary | ICD-10-CM | POA: Diagnosis not present

## 2020-11-17 DIAGNOSIS — T63441D Toxic effect of venom of bees, accidental (unintentional), subsequent encounter: Secondary | ICD-10-CM | POA: Diagnosis not present

## 2020-11-17 DIAGNOSIS — T63461D Toxic effect of venom of wasps, accidental (unintentional), subsequent encounter: Secondary | ICD-10-CM | POA: Diagnosis not present

## 2020-11-17 DIAGNOSIS — T63451D Toxic effect of venom of hornets, accidental (unintentional), subsequent encounter: Secondary | ICD-10-CM | POA: Diagnosis not present

## 2020-11-25 DIAGNOSIS — T63451D Toxic effect of venom of hornets, accidental (unintentional), subsequent encounter: Secondary | ICD-10-CM | POA: Diagnosis not present

## 2020-11-25 DIAGNOSIS — T63441D Toxic effect of venom of bees, accidental (unintentional), subsequent encounter: Secondary | ICD-10-CM | POA: Diagnosis not present

## 2020-11-25 DIAGNOSIS — T63461D Toxic effect of venom of wasps, accidental (unintentional), subsequent encounter: Secondary | ICD-10-CM | POA: Diagnosis not present

## 2020-12-01 DIAGNOSIS — N951 Menopausal and female climacteric states: Secondary | ICD-10-CM | POA: Diagnosis not present

## 2020-12-01 DIAGNOSIS — T63461D Toxic effect of venom of wasps, accidental (unintentional), subsequent encounter: Secondary | ICD-10-CM | POA: Diagnosis not present

## 2020-12-01 DIAGNOSIS — T63451D Toxic effect of venom of hornets, accidental (unintentional), subsequent encounter: Secondary | ICD-10-CM | POA: Diagnosis not present

## 2020-12-01 DIAGNOSIS — R5383 Other fatigue: Secondary | ICD-10-CM | POA: Diagnosis not present

## 2020-12-01 DIAGNOSIS — T63441D Toxic effect of venom of bees, accidental (unintentional), subsequent encounter: Secondary | ICD-10-CM | POA: Diagnosis not present

## 2020-12-05 DIAGNOSIS — N898 Other specified noninflammatory disorders of vagina: Secondary | ICD-10-CM | POA: Diagnosis not present

## 2020-12-05 DIAGNOSIS — M2550 Pain in unspecified joint: Secondary | ICD-10-CM | POA: Diagnosis not present

## 2020-12-05 DIAGNOSIS — N951 Menopausal and female climacteric states: Secondary | ICD-10-CM | POA: Diagnosis not present

## 2020-12-05 DIAGNOSIS — R232 Flushing: Secondary | ICD-10-CM | POA: Diagnosis not present

## 2020-12-08 DIAGNOSIS — T63461D Toxic effect of venom of wasps, accidental (unintentional), subsequent encounter: Secondary | ICD-10-CM | POA: Diagnosis not present

## 2020-12-08 DIAGNOSIS — T63451D Toxic effect of venom of hornets, accidental (unintentional), subsequent encounter: Secondary | ICD-10-CM | POA: Diagnosis not present

## 2020-12-08 DIAGNOSIS — T63441D Toxic effect of venom of bees, accidental (unintentional), subsequent encounter: Secondary | ICD-10-CM | POA: Diagnosis not present

## 2020-12-10 DIAGNOSIS — J320 Chronic maxillary sinusitis: Secondary | ICD-10-CM | POA: Diagnosis not present

## 2020-12-10 DIAGNOSIS — J322 Chronic ethmoidal sinusitis: Secondary | ICD-10-CM | POA: Diagnosis not present

## 2020-12-10 DIAGNOSIS — J321 Chronic frontal sinusitis: Secondary | ICD-10-CM | POA: Diagnosis not present

## 2020-12-15 DIAGNOSIS — T63451D Toxic effect of venom of hornets, accidental (unintentional), subsequent encounter: Secondary | ICD-10-CM | POA: Diagnosis not present

## 2020-12-15 DIAGNOSIS — T63441D Toxic effect of venom of bees, accidental (unintentional), subsequent encounter: Secondary | ICD-10-CM | POA: Diagnosis not present

## 2020-12-15 DIAGNOSIS — T63461D Toxic effect of venom of wasps, accidental (unintentional), subsequent encounter: Secondary | ICD-10-CM | POA: Diagnosis not present

## 2020-12-16 DIAGNOSIS — Z681 Body mass index (BMI) 19 or less, adult: Secondary | ICD-10-CM | POA: Diagnosis not present

## 2020-12-16 DIAGNOSIS — J22 Unspecified acute lower respiratory infection: Secondary | ICD-10-CM | POA: Diagnosis not present

## 2020-12-18 NOTE — Progress Notes (Signed)
65 y.o. G0P0000 Widowed Caucasian female here for annual exam.    Treated for sinusitis recently and given Diflucan for yeast. Patient not sure yeast has totally gone. Having some itching.  Decreased ability to have an orgasm, either internal or external.  New partner.   She is doing HRT and testosterone through [facility].   Received Covid booster.  Had Covid in April, 2022.   PCP:  Sharilyn Sites, MD  Patient's last menstrual period was 02/06/2018 (exact date).           Sexually active: Yes.    The current method of family planning is tubal ligation.    Exercising: No.   Golf, yard work Smoker:  no  Health Maintenance: Pap:   12-15-17 Neg:Neg HR HPV, 11-11-15 Neg:Neg HR HPV, 06-26-15 ASCUS:Neg HR HPV History of abnormal Pap:  yes, 06-26-15 ASCUS:Neg HR HPV,  11-01-14 LGSIL, colpo HPV effect & benign endocervical glandular tissue. MMG: 08-28-20 Diag.Bil.w/Rt.Br.US--Stable benign Lt.Br.calcifications and benign cluster of cysts/BiRads3/prob.benign Colonoscopy:  2012 normal per patient BMD:   01-04-19  Result :Osteopenia. Collene Mares following.  TDaP:  12-18-18 Gardasil:   no HIV: 2016 NR Hep C: 2016 Neg Screening Labs:  PCP   reports that she has been smoking cigarettes. She has been smoking an average of .5 packs per day. She has never used smokeless tobacco. She reports current alcohol use of about 12.0 standard drinks of alcohol per week. She reports that she does not use drugs.  Past Medical History:  Diagnosis Date   Abdominal pain, epigastric    Abnormal Pap smear of cervix    11-01-14 LGSIL   Arthritis    in back   Asthma    as a child   Constipation    COVID-19 virus infection 08/2020   History of Salmonella infection    HSV infection    Irritable bowel syndrome    Melanoma of skin, site unspecified    Rotator cuff syndrome of left shoulder    Seasonal allergies    Shoulder pain     Past Surgical History:  Procedure Laterality Date   COLPOSCOPY  2016   DILATION  AND CURETTAGE OF UTERUS  1997   bleeding   MOLE REMOVAL     cancer   SKIN GRAFT     Thermal Ablation     of the uterus   TUBAL LIGATION      Current Outpatient Medications  Medication Sig Dispense Refill   Cyanocobalamin (VITAMIN B-12) 2500 MCG SUBL Place 5,000 mcg under the tongue daily.     EPINEPHrine 0.3 mg/0.3 mL IJ SOAJ injection Inject into the muscle.     fluticasone (FLONASE) 50 MCG/ACT nasal spray daily.     levocetirizine (XYZAL) 5 MG tablet at bedtime.     montelukast (SINGULAIR) 10 MG tablet as needed.      progesterone (PROMETRIUM) 100 MG capsule      UNABLE TO FIND Allergy shots     UNABLE TO FIND Estrogen/testosterone pellet     UNABLE TO FIND Med Name:Venom injections     valACYclovir (VALTREX) 500 MG tablet Take 1 tablet (500 mg total) by mouth 2 (two) times daily. Take for 3 days as needed. 30 tablet 2   No current facility-administered medications for this visit.    Family History  Problem Relation Age of Onset   Hypertension Mother    Heart attack Mother    Fibroids Mother        h/x hysterectomy  Hypothyroidism Mother    Diabetes Mother    Acromegaly Mother    Alzheimer's disease Mother    Hypertension Father    Kidney Stones Father    Diabetes Father    Heart block Father        stint put in   Leukemia Brother    Breast cancer Paternal Aunt    Breast cancer Paternal Aunt    Diabetes Maternal Grandmother    Heart failure Maternal Grandmother    Depression Maternal Grandfather    Parkinson's disease Maternal Grandfather    Cancer Maternal Grandfather        brain    Review of Systems  Genitourinary:        Vaginal irritation  All other systems reviewed and are negative.  Exam:   BP 110/62   Pulse 79   Ht 5' 4.5" (1.638 m)   Wt 141 lb (64 kg)   LMP 02/06/2018 (Exact Date)   SpO2 98%   BMI 23.83 kg/m     General appearance: alert, cooperative and appears stated age Head: normocephalic, without obvious abnormality,  atraumatic Neck: no adenopathy, supple, symmetrical, trachea midline and thyroid normal to inspection and palpation Lungs: clear to auscultation bilaterally Breasts: normal appearance, no masses or tenderness, No nipple retraction or dimpling, No nipple discharge or bleeding, No axillary adenopathy Heart: regular rate and rhythm Abdomen: soft, non-tender; no masses, no organomegaly Extremities: extremities normal, atraumatic, no cyanosis or edema Skin: skin color, texture, turgor normal. No rashes or lesions Lymph nodes: cervical, supraclavicular, and axillary nodes normal. Neurologic: grossly normal  Pelvic: External genitalia:  no lesions              No abnormal inguinal nodes palpated.              Urethra:  normal appearing urethra with no masses, tenderness or lesions              Bartholins and Skenes: normal                 Vagina: normal appearing vagina with normal color and discharge, no lesions              Cervix: no lesions              Pap taken: yes. Bimanual Exam:  Uterus:  normal size, contour, position, consistency, mobility, non-tender              Adnexa: no mass, fullness, tenderness              Rectal exam: yes, Confirms.              Anus:  normal sphincter tone, no lesions  Chaperone was present for exam:  Estill Bamberg, CMA  Assessment:   Well woman visit with gynecologic exam. Hx postmenopausal bleeding.   Benign biopsy. Hx endometrial ablation.  HRT and testosterone tx at [facility].  Hx HSV. Hx LGSIL. Hx melanoma.  Vaginitis.  STD screening.  Decreased orgasm response.   Plan: Mammogram screening discussed. Self breast awareness reviewed. Pap and HR HPV as above. Guidelines for Calcium, Vitamin D, regular exercise program including cardiovascular and weight bearing exercise. Colonoscopy due.  STD screening.  Vaginitis testing.  Refill Valtrex daily.  Information on [facility].  Follow up annually and prn.    After visit summary  provided.

## 2020-12-22 DIAGNOSIS — T63441D Toxic effect of venom of bees, accidental (unintentional), subsequent encounter: Secondary | ICD-10-CM | POA: Diagnosis not present

## 2020-12-22 DIAGNOSIS — T63451D Toxic effect of venom of hornets, accidental (unintentional), subsequent encounter: Secondary | ICD-10-CM | POA: Diagnosis not present

## 2020-12-22 DIAGNOSIS — T63461D Toxic effect of venom of wasps, accidental (unintentional), subsequent encounter: Secondary | ICD-10-CM | POA: Diagnosis not present

## 2020-12-25 ENCOUNTER — Encounter: Payer: Self-pay | Admitting: Obstetrics and Gynecology

## 2020-12-25 ENCOUNTER — Ambulatory Visit (INDEPENDENT_AMBULATORY_CARE_PROVIDER_SITE_OTHER): Payer: BC Managed Care – PPO | Admitting: Obstetrics and Gynecology

## 2020-12-25 ENCOUNTER — Other Ambulatory Visit (HOSPITAL_COMMUNITY)
Admission: RE | Admit: 2020-12-25 | Discharge: 2020-12-25 | Disposition: A | Discharge status: Home / Self Care | Payer: BC Managed Care – PPO | Source: Ambulatory Visit | Attending: Obstetrics and Gynecology | Admitting: Obstetrics and Gynecology

## 2020-12-25 ENCOUNTER — Other Ambulatory Visit: Payer: Self-pay

## 2020-12-25 VITALS — BP 110/62 | HR 79 | Ht 64.5 in | Wt 141.0 lb

## 2020-12-25 DIAGNOSIS — Z113 Encounter for screening for infections with a predominantly sexual mode of transmission: Secondary | ICD-10-CM | POA: Diagnosis not present

## 2020-12-25 DIAGNOSIS — N760 Acute vaginitis: Secondary | ICD-10-CM | POA: Diagnosis not present

## 2020-12-25 DIAGNOSIS — Z01419 Encounter for gynecological examination (general) (routine) without abnormal findings: Secondary | ICD-10-CM | POA: Diagnosis not present

## 2020-12-25 MED ORDER — VALACYCLOVIR HCL 500 MG PO TABS: ORAL_TABLET | ORAL | 3 refills | Status: DC

## 2020-12-25 NOTE — Patient Instructions (Signed)

## 2020-12-26 ENCOUNTER — Encounter: Payer: Self-pay | Admitting: Obstetrics and Gynecology

## 2020-12-26 LAB — RPR: RPR Ser Ql: NONREACTIVE

## 2020-12-26 LAB — HEPATITIS B SURFACE ANTIGEN: Hepatitis B Surface Ag: NONREACTIVE

## 2020-12-26 LAB — HEPATITIS C ANTIBODY
Hepatitis C Ab: NONREACTIVE
SIGNAL TO CUT-OFF: 0 (ref <1.00)

## 2020-12-26 LAB — HIV ANTIBODY (ROUTINE TESTING W REFLEX): HIV 1&2 Ab, 4th Generation: NONREACTIVE

## 2020-12-28 LAB — CERVICOVAGINAL ANCILLARY ONLY
Bacterial Vaginitis (gardnerella): POSITIVE — AB
Candida Glabrata: NEGATIVE
Candida Vaginitis: NEGATIVE
Chlamydia: NEGATIVE
Comment: NEGATIVE
Comment: NEGATIVE
Comment: NEGATIVE
Comment: NEGATIVE
Comment: NEGATIVE
Comment: NORMAL
Neisseria Gonorrhea: NEGATIVE
Trichomonas: NEGATIVE

## 2020-12-29 ENCOUNTER — Other Ambulatory Visit: Payer: Self-pay | Admitting: *Deleted

## 2020-12-29 DIAGNOSIS — T63461D Toxic effect of venom of wasps, accidental (unintentional), subsequent encounter: Secondary | ICD-10-CM | POA: Diagnosis not present

## 2020-12-29 DIAGNOSIS — T63451D Toxic effect of venom of hornets, accidental (unintentional), subsequent encounter: Secondary | ICD-10-CM | POA: Diagnosis not present

## 2020-12-29 DIAGNOSIS — T63441D Toxic effect of venom of bees, accidental (unintentional), subsequent encounter: Secondary | ICD-10-CM | POA: Diagnosis not present

## 2020-12-29 MED ORDER — METRONIDAZOLE 500 MG PO TABS: 500.0000 mg | ORAL_TABLET | Freq: Two times a day (BID) | ORAL | 0 refills | Status: DC

## 2020-12-30 LAB — CYTOLOGY - PAP
Comment: NEGATIVE
Diagnosis: NEGATIVE
High risk HPV: NEGATIVE

## 2021-01-05 DIAGNOSIS — T63441D Toxic effect of venom of bees, accidental (unintentional), subsequent encounter: Secondary | ICD-10-CM | POA: Diagnosis not present

## 2021-01-05 DIAGNOSIS — T63461D Toxic effect of venom of wasps, accidental (unintentional), subsequent encounter: Secondary | ICD-10-CM | POA: Diagnosis not present

## 2021-01-05 DIAGNOSIS — T63451D Toxic effect of venom of hornets, accidental (unintentional), subsequent encounter: Secondary | ICD-10-CM | POA: Diagnosis not present

## 2021-01-14 DIAGNOSIS — T63451D Toxic effect of venom of hornets, accidental (unintentional), subsequent encounter: Secondary | ICD-10-CM | POA: Diagnosis not present

## 2021-01-14 DIAGNOSIS — T63461D Toxic effect of venom of wasps, accidental (unintentional), subsequent encounter: Secondary | ICD-10-CM | POA: Diagnosis not present

## 2021-01-14 DIAGNOSIS — T63441D Toxic effect of venom of bees, accidental (unintentional), subsequent encounter: Secondary | ICD-10-CM | POA: Diagnosis not present

## 2021-01-19 DIAGNOSIS — T63461D Toxic effect of venom of wasps, accidental (unintentional), subsequent encounter: Secondary | ICD-10-CM | POA: Diagnosis not present

## 2021-01-19 DIAGNOSIS — T63441D Toxic effect of venom of bees, accidental (unintentional), subsequent encounter: Secondary | ICD-10-CM | POA: Diagnosis not present

## 2021-01-19 DIAGNOSIS — T63451D Toxic effect of venom of hornets, accidental (unintentional), subsequent encounter: Secondary | ICD-10-CM | POA: Diagnosis not present

## 2021-01-27 DIAGNOSIS — T63441D Toxic effect of venom of bees, accidental (unintentional), subsequent encounter: Secondary | ICD-10-CM | POA: Diagnosis not present

## 2021-01-27 DIAGNOSIS — T63451D Toxic effect of venom of hornets, accidental (unintentional), subsequent encounter: Secondary | ICD-10-CM | POA: Diagnosis not present

## 2021-01-27 DIAGNOSIS — T63461D Toxic effect of venom of wasps, accidental (unintentional), subsequent encounter: Secondary | ICD-10-CM | POA: Diagnosis not present

## 2021-02-02 DIAGNOSIS — T63451D Toxic effect of venom of hornets, accidental (unintentional), subsequent encounter: Secondary | ICD-10-CM | POA: Diagnosis not present

## 2021-02-02 DIAGNOSIS — T63461D Toxic effect of venom of wasps, accidental (unintentional), subsequent encounter: Secondary | ICD-10-CM | POA: Diagnosis not present

## 2021-02-02 DIAGNOSIS — T63441D Toxic effect of venom of bees, accidental (unintentional), subsequent encounter: Secondary | ICD-10-CM | POA: Diagnosis not present

## 2021-02-24 DIAGNOSIS — T63451D Toxic effect of venom of hornets, accidental (unintentional), subsequent encounter: Secondary | ICD-10-CM | POA: Diagnosis not present

## 2021-02-24 DIAGNOSIS — J3081 Allergic rhinitis due to animal (cat) (dog) hair and dander: Secondary | ICD-10-CM | POA: Diagnosis not present

## 2021-02-24 DIAGNOSIS — T781XXD Other adverse food reactions, not elsewhere classified, subsequent encounter: Secondary | ICD-10-CM | POA: Diagnosis not present

## 2021-02-24 DIAGNOSIS — T63461D Toxic effect of venom of wasps, accidental (unintentional), subsequent encounter: Secondary | ICD-10-CM | POA: Diagnosis not present

## 2021-02-24 DIAGNOSIS — J3089 Other allergic rhinitis: Secondary | ICD-10-CM | POA: Diagnosis not present

## 2021-02-24 DIAGNOSIS — T63441D Toxic effect of venom of bees, accidental (unintentional), subsequent encounter: Secondary | ICD-10-CM | POA: Diagnosis not present

## 2021-02-24 DIAGNOSIS — J301 Allergic rhinitis due to pollen: Secondary | ICD-10-CM | POA: Diagnosis not present

## 2021-03-02 DIAGNOSIS — T63461D Toxic effect of venom of wasps, accidental (unintentional), subsequent encounter: Secondary | ICD-10-CM | POA: Diagnosis not present

## 2021-03-02 DIAGNOSIS — J3089 Other allergic rhinitis: Secondary | ICD-10-CM | POA: Diagnosis not present

## 2021-03-02 DIAGNOSIS — T63441D Toxic effect of venom of bees, accidental (unintentional), subsequent encounter: Secondary | ICD-10-CM | POA: Diagnosis not present

## 2021-03-02 DIAGNOSIS — T63451D Toxic effect of venom of hornets, accidental (unintentional), subsequent encounter: Secondary | ICD-10-CM | POA: Diagnosis not present

## 2021-03-05 DIAGNOSIS — J3081 Allergic rhinitis due to animal (cat) (dog) hair and dander: Secondary | ICD-10-CM | POA: Diagnosis not present

## 2021-03-05 DIAGNOSIS — J301 Allergic rhinitis due to pollen: Secondary | ICD-10-CM | POA: Diagnosis not present

## 2021-03-05 DIAGNOSIS — J3089 Other allergic rhinitis: Secondary | ICD-10-CM | POA: Diagnosis not present

## 2021-03-09 DIAGNOSIS — T63441D Toxic effect of venom of bees, accidental (unintentional), subsequent encounter: Secondary | ICD-10-CM | POA: Diagnosis not present

## 2021-03-09 DIAGNOSIS — R5383 Other fatigue: Secondary | ICD-10-CM | POA: Diagnosis not present

## 2021-03-09 DIAGNOSIS — N951 Menopausal and female climacteric states: Secondary | ICD-10-CM | POA: Diagnosis not present

## 2021-03-09 DIAGNOSIS — T63451D Toxic effect of venom of hornets, accidental (unintentional), subsequent encounter: Secondary | ICD-10-CM | POA: Diagnosis not present

## 2021-03-09 DIAGNOSIS — T63461D Toxic effect of venom of wasps, accidental (unintentional), subsequent encounter: Secondary | ICD-10-CM | POA: Diagnosis not present

## 2021-03-12 DIAGNOSIS — L538 Other specified erythematous conditions: Secondary | ICD-10-CM | POA: Diagnosis not present

## 2021-03-12 DIAGNOSIS — D225 Melanocytic nevi of trunk: Secondary | ICD-10-CM | POA: Diagnosis not present

## 2021-03-12 DIAGNOSIS — D485 Neoplasm of uncertain behavior of skin: Secondary | ICD-10-CM | POA: Diagnosis not present

## 2021-03-12 DIAGNOSIS — L820 Inflamed seborrheic keratosis: Secondary | ICD-10-CM | POA: Diagnosis not present

## 2021-03-12 DIAGNOSIS — L570 Actinic keratosis: Secondary | ICD-10-CM | POA: Diagnosis not present

## 2021-03-12 DIAGNOSIS — Z85820 Personal history of malignant melanoma of skin: Secondary | ICD-10-CM | POA: Diagnosis not present

## 2021-03-12 DIAGNOSIS — J301 Allergic rhinitis due to pollen: Secondary | ICD-10-CM | POA: Diagnosis not present

## 2021-03-12 DIAGNOSIS — C44329 Squamous cell carcinoma of skin of other parts of face: Secondary | ICD-10-CM | POA: Diagnosis not present

## 2021-03-12 DIAGNOSIS — L298 Other pruritus: Secondary | ICD-10-CM | POA: Diagnosis not present

## 2021-03-12 DIAGNOSIS — J3089 Other allergic rhinitis: Secondary | ICD-10-CM | POA: Diagnosis not present

## 2021-03-12 DIAGNOSIS — L814 Other melanin hyperpigmentation: Secondary | ICD-10-CM | POA: Diagnosis not present

## 2021-03-12 DIAGNOSIS — L821 Other seborrheic keratosis: Secondary | ICD-10-CM | POA: Diagnosis not present

## 2021-03-12 DIAGNOSIS — R208 Other disturbances of skin sensation: Secondary | ICD-10-CM | POA: Diagnosis not present

## 2021-03-12 DIAGNOSIS — J3081 Allergic rhinitis due to animal (cat) (dog) hair and dander: Secondary | ICD-10-CM | POA: Diagnosis not present

## 2021-03-16 DIAGNOSIS — N951 Menopausal and female climacteric states: Secondary | ICD-10-CM | POA: Diagnosis not present

## 2021-03-16 DIAGNOSIS — Z6822 Body mass index (BMI) 22.0-22.9, adult: Secondary | ICD-10-CM | POA: Diagnosis not present

## 2021-03-16 DIAGNOSIS — M2550 Pain in unspecified joint: Secondary | ICD-10-CM | POA: Diagnosis not present

## 2021-03-16 DIAGNOSIS — R5383 Other fatigue: Secondary | ICD-10-CM | POA: Diagnosis not present

## 2021-03-16 DIAGNOSIS — T63461D Toxic effect of venom of wasps, accidental (unintentional), subsequent encounter: Secondary | ICD-10-CM | POA: Diagnosis not present

## 2021-03-16 DIAGNOSIS — T63451D Toxic effect of venom of hornets, accidental (unintentional), subsequent encounter: Secondary | ICD-10-CM | POA: Diagnosis not present

## 2021-03-16 DIAGNOSIS — T63441D Toxic effect of venom of bees, accidental (unintentional), subsequent encounter: Secondary | ICD-10-CM | POA: Diagnosis not present

## 2021-03-20 DIAGNOSIS — J3089 Other allergic rhinitis: Secondary | ICD-10-CM | POA: Diagnosis not present

## 2021-03-20 DIAGNOSIS — J3081 Allergic rhinitis due to animal (cat) (dog) hair and dander: Secondary | ICD-10-CM | POA: Diagnosis not present

## 2021-03-20 DIAGNOSIS — J301 Allergic rhinitis due to pollen: Secondary | ICD-10-CM | POA: Diagnosis not present

## 2021-03-23 DIAGNOSIS — T63461D Toxic effect of venom of wasps, accidental (unintentional), subsequent encounter: Secondary | ICD-10-CM | POA: Diagnosis not present

## 2021-03-23 DIAGNOSIS — T63451D Toxic effect of venom of hornets, accidental (unintentional), subsequent encounter: Secondary | ICD-10-CM | POA: Diagnosis not present

## 2021-03-23 DIAGNOSIS — T63441D Toxic effect of venom of bees, accidental (unintentional), subsequent encounter: Secondary | ICD-10-CM | POA: Diagnosis not present

## 2021-03-30 DIAGNOSIS — J3081 Allergic rhinitis due to animal (cat) (dog) hair and dander: Secondary | ICD-10-CM | POA: Diagnosis not present

## 2021-03-30 DIAGNOSIS — J301 Allergic rhinitis due to pollen: Secondary | ICD-10-CM | POA: Diagnosis not present

## 2021-03-30 DIAGNOSIS — J3089 Other allergic rhinitis: Secondary | ICD-10-CM | POA: Diagnosis not present

## 2021-04-03 DIAGNOSIS — J3089 Other allergic rhinitis: Secondary | ICD-10-CM | POA: Diagnosis not present

## 2021-04-03 DIAGNOSIS — J301 Allergic rhinitis due to pollen: Secondary | ICD-10-CM | POA: Diagnosis not present

## 2021-04-03 DIAGNOSIS — J3081 Allergic rhinitis due to animal (cat) (dog) hair and dander: Secondary | ICD-10-CM | POA: Diagnosis not present

## 2021-04-06 DIAGNOSIS — T63441D Toxic effect of venom of bees, accidental (unintentional), subsequent encounter: Secondary | ICD-10-CM | POA: Diagnosis not present

## 2021-04-06 DIAGNOSIS — T63451D Toxic effect of venom of hornets, accidental (unintentional), subsequent encounter: Secondary | ICD-10-CM | POA: Diagnosis not present

## 2021-04-06 DIAGNOSIS — T63461D Toxic effect of venom of wasps, accidental (unintentional), subsequent encounter: Secondary | ICD-10-CM | POA: Diagnosis not present

## 2021-04-09 DIAGNOSIS — J069 Acute upper respiratory infection, unspecified: Secondary | ICD-10-CM | POA: Diagnosis not present

## 2021-04-09 DIAGNOSIS — J019 Acute sinusitis, unspecified: Secondary | ICD-10-CM | POA: Diagnosis not present

## 2021-04-13 DIAGNOSIS — T63461D Toxic effect of venom of wasps, accidental (unintentional), subsequent encounter: Secondary | ICD-10-CM | POA: Diagnosis not present

## 2021-04-13 DIAGNOSIS — T63441D Toxic effect of venom of bees, accidental (unintentional), subsequent encounter: Secondary | ICD-10-CM | POA: Diagnosis not present

## 2021-04-13 DIAGNOSIS — T63451D Toxic effect of venom of hornets, accidental (unintentional), subsequent encounter: Secondary | ICD-10-CM | POA: Diagnosis not present

## 2021-04-20 DIAGNOSIS — J3081 Allergic rhinitis due to animal (cat) (dog) hair and dander: Secondary | ICD-10-CM | POA: Diagnosis not present

## 2021-04-20 DIAGNOSIS — J3089 Other allergic rhinitis: Secondary | ICD-10-CM | POA: Diagnosis not present

## 2021-04-20 DIAGNOSIS — J301 Allergic rhinitis due to pollen: Secondary | ICD-10-CM | POA: Diagnosis not present

## 2021-04-27 DIAGNOSIS — T63461D Toxic effect of venom of wasps, accidental (unintentional), subsequent encounter: Secondary | ICD-10-CM | POA: Diagnosis not present

## 2021-04-27 DIAGNOSIS — T63441D Toxic effect of venom of bees, accidental (unintentional), subsequent encounter: Secondary | ICD-10-CM | POA: Diagnosis not present

## 2021-04-27 DIAGNOSIS — T63451D Toxic effect of venom of hornets, accidental (unintentional), subsequent encounter: Secondary | ICD-10-CM | POA: Diagnosis not present

## 2021-05-05 DIAGNOSIS — J301 Allergic rhinitis due to pollen: Secondary | ICD-10-CM | POA: Diagnosis not present

## 2021-05-05 DIAGNOSIS — J3081 Allergic rhinitis due to animal (cat) (dog) hair and dander: Secondary | ICD-10-CM | POA: Diagnosis not present

## 2021-05-05 DIAGNOSIS — J3089 Other allergic rhinitis: Secondary | ICD-10-CM | POA: Diagnosis not present

## 2021-05-11 DIAGNOSIS — J3081 Allergic rhinitis due to animal (cat) (dog) hair and dander: Secondary | ICD-10-CM | POA: Diagnosis not present

## 2021-05-11 DIAGNOSIS — J3089 Other allergic rhinitis: Secondary | ICD-10-CM | POA: Diagnosis not present

## 2021-05-11 DIAGNOSIS — J301 Allergic rhinitis due to pollen: Secondary | ICD-10-CM | POA: Diagnosis not present

## 2021-05-26 DIAGNOSIS — J301 Allergic rhinitis due to pollen: Secondary | ICD-10-CM | POA: Diagnosis not present

## 2021-05-26 DIAGNOSIS — J3089 Other allergic rhinitis: Secondary | ICD-10-CM | POA: Diagnosis not present

## 2021-05-26 DIAGNOSIS — J3081 Allergic rhinitis due to animal (cat) (dog) hair and dander: Secondary | ICD-10-CM | POA: Diagnosis not present

## 2021-06-01 DIAGNOSIS — T63451D Toxic effect of venom of hornets, accidental (unintentional), subsequent encounter: Secondary | ICD-10-CM | POA: Diagnosis not present

## 2021-06-01 DIAGNOSIS — T63441D Toxic effect of venom of bees, accidental (unintentional), subsequent encounter: Secondary | ICD-10-CM | POA: Diagnosis not present

## 2021-06-01 DIAGNOSIS — T63461D Toxic effect of venom of wasps, accidental (unintentional), subsequent encounter: Secondary | ICD-10-CM | POA: Diagnosis not present

## 2021-06-15 DIAGNOSIS — R5383 Other fatigue: Secondary | ICD-10-CM | POA: Diagnosis not present

## 2021-06-15 DIAGNOSIS — J3089 Other allergic rhinitis: Secondary | ICD-10-CM | POA: Diagnosis not present

## 2021-06-15 DIAGNOSIS — J3081 Allergic rhinitis due to animal (cat) (dog) hair and dander: Secondary | ICD-10-CM | POA: Diagnosis not present

## 2021-06-15 DIAGNOSIS — J301 Allergic rhinitis due to pollen: Secondary | ICD-10-CM | POA: Diagnosis not present

## 2021-06-15 DIAGNOSIS — N951 Menopausal and female climacteric states: Secondary | ICD-10-CM | POA: Diagnosis not present

## 2021-06-22 DIAGNOSIS — J3081 Allergic rhinitis due to animal (cat) (dog) hair and dander: Secondary | ICD-10-CM | POA: Diagnosis not present

## 2021-06-22 DIAGNOSIS — J3089 Other allergic rhinitis: Secondary | ICD-10-CM | POA: Diagnosis not present

## 2021-06-22 DIAGNOSIS — J301 Allergic rhinitis due to pollen: Secondary | ICD-10-CM | POA: Diagnosis not present

## 2021-06-23 DIAGNOSIS — R6882 Decreased libido: Secondary | ICD-10-CM | POA: Diagnosis not present

## 2021-06-23 DIAGNOSIS — G479 Sleep disorder, unspecified: Secondary | ICD-10-CM | POA: Diagnosis not present

## 2021-06-23 DIAGNOSIS — N951 Menopausal and female climacteric states: Secondary | ICD-10-CM | POA: Diagnosis not present

## 2021-06-23 DIAGNOSIS — Z6824 Body mass index (BMI) 24.0-24.9, adult: Secondary | ICD-10-CM | POA: Diagnosis not present

## 2021-07-06 DIAGNOSIS — T63441D Toxic effect of venom of bees, accidental (unintentional), subsequent encounter: Secondary | ICD-10-CM | POA: Diagnosis not present

## 2021-07-06 DIAGNOSIS — T63451D Toxic effect of venom of hornets, accidental (unintentional), subsequent encounter: Secondary | ICD-10-CM | POA: Diagnosis not present

## 2021-07-06 DIAGNOSIS — T63461D Toxic effect of venom of wasps, accidental (unintentional), subsequent encounter: Secondary | ICD-10-CM | POA: Diagnosis not present

## 2021-07-17 DIAGNOSIS — M9902 Segmental and somatic dysfunction of thoracic region: Secondary | ICD-10-CM | POA: Diagnosis not present

## 2021-07-17 DIAGNOSIS — M5441 Lumbago with sciatica, right side: Secondary | ICD-10-CM | POA: Diagnosis not present

## 2021-07-17 DIAGNOSIS — M9903 Segmental and somatic dysfunction of lumbar region: Secondary | ICD-10-CM | POA: Diagnosis not present

## 2021-07-17 DIAGNOSIS — M9905 Segmental and somatic dysfunction of pelvic region: Secondary | ICD-10-CM | POA: Diagnosis not present

## 2021-07-20 DIAGNOSIS — J3089 Other allergic rhinitis: Secondary | ICD-10-CM | POA: Diagnosis not present

## 2021-07-20 DIAGNOSIS — J3081 Allergic rhinitis due to animal (cat) (dog) hair and dander: Secondary | ICD-10-CM | POA: Diagnosis not present

## 2021-07-20 DIAGNOSIS — J301 Allergic rhinitis due to pollen: Secondary | ICD-10-CM | POA: Diagnosis not present

## 2021-07-21 DIAGNOSIS — M9903 Segmental and somatic dysfunction of lumbar region: Secondary | ICD-10-CM | POA: Diagnosis not present

## 2021-07-21 DIAGNOSIS — M9902 Segmental and somatic dysfunction of thoracic region: Secondary | ICD-10-CM | POA: Diagnosis not present

## 2021-07-21 DIAGNOSIS — M9905 Segmental and somatic dysfunction of pelvic region: Secondary | ICD-10-CM | POA: Diagnosis not present

## 2021-07-21 DIAGNOSIS — M5441 Lumbago with sciatica, right side: Secondary | ICD-10-CM | POA: Diagnosis not present

## 2021-07-23 DIAGNOSIS — D485 Neoplasm of uncertain behavior of skin: Secondary | ICD-10-CM | POA: Diagnosis not present

## 2021-07-23 DIAGNOSIS — C44519 Basal cell carcinoma of skin of other part of trunk: Secondary | ICD-10-CM | POA: Diagnosis not present

## 2021-07-23 DIAGNOSIS — Z86007 Personal history of in-situ neoplasm of skin: Secondary | ICD-10-CM | POA: Diagnosis not present

## 2021-07-23 DIAGNOSIS — L570 Actinic keratosis: Secondary | ICD-10-CM | POA: Diagnosis not present

## 2021-07-23 DIAGNOSIS — Z08 Encounter for follow-up examination after completed treatment for malignant neoplasm: Secondary | ICD-10-CM | POA: Diagnosis not present

## 2021-07-24 DIAGNOSIS — M5441 Lumbago with sciatica, right side: Secondary | ICD-10-CM | POA: Diagnosis not present

## 2021-07-24 DIAGNOSIS — M9905 Segmental and somatic dysfunction of pelvic region: Secondary | ICD-10-CM | POA: Diagnosis not present

## 2021-07-24 DIAGNOSIS — M9902 Segmental and somatic dysfunction of thoracic region: Secondary | ICD-10-CM | POA: Diagnosis not present

## 2021-07-24 DIAGNOSIS — M9903 Segmental and somatic dysfunction of lumbar region: Secondary | ICD-10-CM | POA: Diagnosis not present

## 2021-07-27 DIAGNOSIS — J3089 Other allergic rhinitis: Secondary | ICD-10-CM | POA: Diagnosis not present

## 2021-07-27 DIAGNOSIS — J301 Allergic rhinitis due to pollen: Secondary | ICD-10-CM | POA: Diagnosis not present

## 2021-07-27 DIAGNOSIS — J3081 Allergic rhinitis due to animal (cat) (dog) hair and dander: Secondary | ICD-10-CM | POA: Diagnosis not present

## 2021-07-28 DIAGNOSIS — M9905 Segmental and somatic dysfunction of pelvic region: Secondary | ICD-10-CM | POA: Diagnosis not present

## 2021-07-28 DIAGNOSIS — M5441 Lumbago with sciatica, right side: Secondary | ICD-10-CM | POA: Diagnosis not present

## 2021-07-28 DIAGNOSIS — M9903 Segmental and somatic dysfunction of lumbar region: Secondary | ICD-10-CM | POA: Diagnosis not present

## 2021-07-28 DIAGNOSIS — M9902 Segmental and somatic dysfunction of thoracic region: Secondary | ICD-10-CM | POA: Diagnosis not present

## 2021-07-30 DIAGNOSIS — M5441 Lumbago with sciatica, right side: Secondary | ICD-10-CM | POA: Diagnosis not present

## 2021-07-30 DIAGNOSIS — M9902 Segmental and somatic dysfunction of thoracic region: Secondary | ICD-10-CM | POA: Diagnosis not present

## 2021-07-30 DIAGNOSIS — M9905 Segmental and somatic dysfunction of pelvic region: Secondary | ICD-10-CM | POA: Diagnosis not present

## 2021-07-30 DIAGNOSIS — M9903 Segmental and somatic dysfunction of lumbar region: Secondary | ICD-10-CM | POA: Diagnosis not present

## 2021-08-03 DIAGNOSIS — T63441D Toxic effect of venom of bees, accidental (unintentional), subsequent encounter: Secondary | ICD-10-CM | POA: Diagnosis not present

## 2021-08-03 DIAGNOSIS — T63461D Toxic effect of venom of wasps, accidental (unintentional), subsequent encounter: Secondary | ICD-10-CM | POA: Diagnosis not present

## 2021-08-05 ENCOUNTER — Other Ambulatory Visit (HOSPITAL_COMMUNITY): Payer: Self-pay | Admitting: Physician Assistant

## 2021-08-05 DIAGNOSIS — M9903 Segmental and somatic dysfunction of lumbar region: Secondary | ICD-10-CM | POA: Diagnosis not present

## 2021-08-05 DIAGNOSIS — M5441 Lumbago with sciatica, right side: Secondary | ICD-10-CM | POA: Diagnosis not present

## 2021-08-05 DIAGNOSIS — M9905 Segmental and somatic dysfunction of pelvic region: Secondary | ICD-10-CM | POA: Diagnosis not present

## 2021-08-05 DIAGNOSIS — R921 Mammographic calcification found on diagnostic imaging of breast: Secondary | ICD-10-CM

## 2021-08-05 DIAGNOSIS — M9902 Segmental and somatic dysfunction of thoracic region: Secondary | ICD-10-CM | POA: Diagnosis not present

## 2021-08-07 DIAGNOSIS — R1030 Lower abdominal pain, unspecified: Secondary | ICD-10-CM | POA: Diagnosis not present

## 2021-08-07 DIAGNOSIS — M9903 Segmental and somatic dysfunction of lumbar region: Secondary | ICD-10-CM | POA: Diagnosis not present

## 2021-08-07 DIAGNOSIS — Z6823 Body mass index (BMI) 23.0-23.9, adult: Secondary | ICD-10-CM | POA: Diagnosis not present

## 2021-08-07 DIAGNOSIS — M5441 Lumbago with sciatica, right side: Secondary | ICD-10-CM | POA: Diagnosis not present

## 2021-08-07 DIAGNOSIS — M9902 Segmental and somatic dysfunction of thoracic region: Secondary | ICD-10-CM | POA: Diagnosis not present

## 2021-08-07 DIAGNOSIS — M9905 Segmental and somatic dysfunction of pelvic region: Secondary | ICD-10-CM | POA: Diagnosis not present

## 2021-08-10 DIAGNOSIS — J3089 Other allergic rhinitis: Secondary | ICD-10-CM | POA: Diagnosis not present

## 2021-08-10 DIAGNOSIS — J301 Allergic rhinitis due to pollen: Secondary | ICD-10-CM | POA: Diagnosis not present

## 2021-08-10 DIAGNOSIS — J3081 Allergic rhinitis due to animal (cat) (dog) hair and dander: Secondary | ICD-10-CM | POA: Diagnosis not present

## 2021-08-13 ENCOUNTER — Encounter: Payer: Self-pay | Admitting: Internal Medicine

## 2021-08-17 DIAGNOSIS — J3089 Other allergic rhinitis: Secondary | ICD-10-CM | POA: Diagnosis not present

## 2021-08-17 DIAGNOSIS — J301 Allergic rhinitis due to pollen: Secondary | ICD-10-CM | POA: Diagnosis not present

## 2021-08-17 DIAGNOSIS — J3081 Allergic rhinitis due to animal (cat) (dog) hair and dander: Secondary | ICD-10-CM | POA: Diagnosis not present

## 2021-08-24 DIAGNOSIS — J301 Allergic rhinitis due to pollen: Secondary | ICD-10-CM | POA: Diagnosis not present

## 2021-08-24 DIAGNOSIS — J3081 Allergic rhinitis due to animal (cat) (dog) hair and dander: Secondary | ICD-10-CM | POA: Diagnosis not present

## 2021-08-24 DIAGNOSIS — J3089 Other allergic rhinitis: Secondary | ICD-10-CM | POA: Diagnosis not present

## 2021-08-25 DIAGNOSIS — J22 Unspecified acute lower respiratory infection: Secondary | ICD-10-CM | POA: Diagnosis not present

## 2021-08-25 DIAGNOSIS — Z6824 Body mass index (BMI) 24.0-24.9, adult: Secondary | ICD-10-CM | POA: Diagnosis not present

## 2021-08-31 DIAGNOSIS — J301 Allergic rhinitis due to pollen: Secondary | ICD-10-CM | POA: Diagnosis not present

## 2021-08-31 DIAGNOSIS — J3081 Allergic rhinitis due to animal (cat) (dog) hair and dander: Secondary | ICD-10-CM | POA: Diagnosis not present

## 2021-08-31 DIAGNOSIS — J3089 Other allergic rhinitis: Secondary | ICD-10-CM | POA: Diagnosis not present

## 2021-09-03 DIAGNOSIS — J3089 Other allergic rhinitis: Secondary | ICD-10-CM | POA: Diagnosis not present

## 2021-09-07 ENCOUNTER — Ambulatory Visit: Payer: BC Managed Care – PPO | Admitting: Gastroenterology

## 2021-09-14 DIAGNOSIS — T63451D Toxic effect of venom of hornets, accidental (unintentional), subsequent encounter: Secondary | ICD-10-CM | POA: Diagnosis not present

## 2021-09-14 DIAGNOSIS — N951 Menopausal and female climacteric states: Secondary | ICD-10-CM | POA: Diagnosis not present

## 2021-09-14 DIAGNOSIS — T63441D Toxic effect of venom of bees, accidental (unintentional), subsequent encounter: Secondary | ICD-10-CM | POA: Diagnosis not present

## 2021-09-14 DIAGNOSIS — T63461D Toxic effect of venom of wasps, accidental (unintentional), subsequent encounter: Secondary | ICD-10-CM | POA: Diagnosis not present

## 2021-09-14 DIAGNOSIS — R5383 Other fatigue: Secondary | ICD-10-CM | POA: Diagnosis not present

## 2021-09-15 ENCOUNTER — Ambulatory Visit (HOSPITAL_COMMUNITY)
Admission: RE | Admit: 2021-09-15 | Discharge: 2021-09-15 | Disposition: A | Discharge status: Home / Self Care | Payer: BC Managed Care – PPO | Source: Ambulatory Visit | Attending: Physician Assistant | Admitting: Physician Assistant

## 2021-09-15 DIAGNOSIS — R921 Mammographic calcification found on diagnostic imaging of breast: Secondary | ICD-10-CM

## 2021-09-16 DIAGNOSIS — E559 Vitamin D deficiency, unspecified: Secondary | ICD-10-CM | POA: Diagnosis not present

## 2021-09-16 DIAGNOSIS — Z6824 Body mass index (BMI) 24.0-24.9, adult: Secondary | ICD-10-CM | POA: Diagnosis not present

## 2021-09-16 DIAGNOSIS — Z0000 Encounter for general adult medical examination without abnormal findings: Secondary | ICD-10-CM | POA: Diagnosis not present

## 2021-09-16 DIAGNOSIS — Z1389 Encounter for screening for other disorder: Secondary | ICD-10-CM | POA: Diagnosis not present

## 2021-09-16 DIAGNOSIS — Z1331 Encounter for screening for depression: Secondary | ICD-10-CM | POA: Diagnosis not present

## 2021-09-16 DIAGNOSIS — Z0001 Encounter for general adult medical examination with abnormal findings: Secondary | ICD-10-CM | POA: Diagnosis not present

## 2021-09-16 DIAGNOSIS — R1030 Lower abdominal pain, unspecified: Secondary | ICD-10-CM | POA: Diagnosis not present

## 2021-09-16 DIAGNOSIS — J22 Unspecified acute lower respiratory infection: Secondary | ICD-10-CM | POA: Diagnosis not present

## 2021-09-16 DIAGNOSIS — E860 Dehydration: Secondary | ICD-10-CM | POA: Diagnosis not present

## 2021-09-16 DIAGNOSIS — E538 Deficiency of other specified B group vitamins: Secondary | ICD-10-CM | POA: Diagnosis not present

## 2021-09-17 ENCOUNTER — Other Ambulatory Visit: Payer: Self-pay | Admitting: Internal Medicine

## 2021-09-17 ENCOUNTER — Other Ambulatory Visit (HOSPITAL_COMMUNITY): Payer: Self-pay | Admitting: Internal Medicine

## 2021-09-17 DIAGNOSIS — Z122 Encounter for screening for malignant neoplasm of respiratory organs: Secondary | ICD-10-CM

## 2021-09-17 DIAGNOSIS — Z0001 Encounter for general adult medical examination with abnormal findings: Secondary | ICD-10-CM

## 2021-09-21 DIAGNOSIS — N898 Other specified noninflammatory disorders of vagina: Secondary | ICD-10-CM | POA: Diagnosis not present

## 2021-09-21 DIAGNOSIS — R6882 Decreased libido: Secondary | ICD-10-CM | POA: Diagnosis not present

## 2021-09-21 DIAGNOSIS — N951 Menopausal and female climacteric states: Secondary | ICD-10-CM | POA: Diagnosis not present

## 2021-09-21 DIAGNOSIS — Z6823 Body mass index (BMI) 23.0-23.9, adult: Secondary | ICD-10-CM | POA: Diagnosis not present

## 2021-09-22 DIAGNOSIS — C44519 Basal cell carcinoma of skin of other part of trunk: Secondary | ICD-10-CM | POA: Diagnosis not present

## 2021-09-22 DIAGNOSIS — J301 Allergic rhinitis due to pollen: Secondary | ICD-10-CM | POA: Diagnosis not present

## 2021-09-22 DIAGNOSIS — J3089 Other allergic rhinitis: Secondary | ICD-10-CM | POA: Diagnosis not present

## 2021-09-22 DIAGNOSIS — J3081 Allergic rhinitis due to animal (cat) (dog) hair and dander: Secondary | ICD-10-CM | POA: Diagnosis not present

## 2021-09-28 DIAGNOSIS — J3081 Allergic rhinitis due to animal (cat) (dog) hair and dander: Secondary | ICD-10-CM | POA: Diagnosis not present

## 2021-09-28 DIAGNOSIS — J3089 Other allergic rhinitis: Secondary | ICD-10-CM | POA: Diagnosis not present

## 2021-09-28 DIAGNOSIS — J301 Allergic rhinitis due to pollen: Secondary | ICD-10-CM | POA: Diagnosis not present

## 2021-09-28 NOTE — Progress Notes (Signed)
? ?Referring Provider: Sharilyn Sites, MD ?Primary Care Physician:  Sharilyn Sites, MD ?Primary Gastroenterologist:  Dr. Abbey Chatters ? ?Chief Complaint  ?Patient presents with  ? Colonoscopy  ?  Abdominal pain    ? ? ?HPI:   ?[name] is a 66 y.o. female with history of IBS-C, gastritis on EGD in 2011, presenting today at the request of Dr. Hilma Favors for lower abdominal pain.  We have not seen patient since December 2011. ? ?EGD 07/28/2009: Patent Schatzki's ring, patchy erythema the body and antrum with occasional erosion biopsied.  Pathology with mild chronic gastritis, negative for H. pylori. ? ?Colonoscopy 07/28/2009: Slightly tortuous colon multiple sigmoid colon diverticula. ? ?Today: ?Acute onset abdominal pain in the latter part of March or April that started at her bellybutton and went straight down.  Symptoms lasted for 5 days, then resolved.  Reports she did have a little diarrhea during this time, but it resolved.  Never had nausea, vomiting, or fever.  Bowels have returned to normal.  Denies BRBPR, melena, unintentional weight loss.  Occasional indigestion, but nothing routine.  No dysphagia. ? ? ?Past Medical History:  ?Diagnosis Date  ? Abdominal pain, epigastric   ? Abnormal Pap smear of cervix   ? 11-01-14 LGSIL  ? Arthritis   ? in back  ? Asthma   ? as a child  ? Constipation   ? COVID-19 virus infection 08/2020  ? History of Salmonella infection   ? HSV infection   ? Irritable bowel syndrome   ? Melanoma of skin, site unspecified   ? Rotator cuff syndrome of left shoulder   ? Seasonal allergies   ? Shoulder pain   ? ? ?Past Surgical History:  ?Procedure Laterality Date  ? COLPOSCOPY  2016  ? DILATION AND CURETTAGE OF UTERUS  1997  ? bleeding  ? MOLE REMOVAL    ? cancer  ? SKIN GRAFT    ? Thermal Ablation    ? of the uterus  ? TUBAL LIGATION    ? ? ?Current Outpatient Medications  ?Medication Sig Dispense Refill  ? Cyanocobalamin (VITAMIN B-12) 2500 MCG SUBL Place 5,000 mcg under the tongue daily.    ?  EPINEPHrine 0.3 mg/0.3 mL IJ SOAJ injection Inject into the muscle.    ? fluticasone (FLONASE) 50 MCG/ACT nasal spray daily.    ? levocetirizine (XYZAL) 5 MG tablet at bedtime.    ? montelukast (SINGULAIR) 10 MG tablet as needed.     ? progesterone (PROMETRIUM) 100 MG capsule     ? UNABLE TO FIND Allergy shots    ? UNABLE TO FIND Estrogen/testosterone pellet    ? UNABLE TO FIND Med Name:Venom injections    ? valACYclovir (VALTREX) 500 MG tablet Take one tablet (500 mg) by mouth daily for prevention.  Take one tablet by mouth twice a day for 3 days for an outbreak. 110 tablet 3  ? ?No current facility-administered medications for this visit.  ? ? ?Allergies as of 09/30/2021 - Review Complete 09/30/2021  ?Allergen Reaction Noted  ? Bee venom Anaphylaxis 12/24/2019  ? ? ?Family History  ?Problem Relation Age of Onset  ? Hypertension Mother   ? Heart attack Mother   ? Fibroids Mother   ?     h/x hysterectomy  ? Hypothyroidism Mother   ? Diabetes Mother   ? Acromegaly Mother   ? Alzheimer's disease Mother   ? Hypertension Father   ? Kidney Stones Father   ? Diabetes Father   ?  Heart block Father   ?     stint put in  ? Leukemia Brother   ? Diabetes Maternal Grandmother   ? Heart failure Maternal Grandmother   ? Depression Maternal Grandfather   ? Parkinson's disease Maternal Grandfather   ? Cancer Maternal Grandfather   ?     brain  ? Breast cancer Paternal Aunt   ? Breast cancer Paternal Aunt   ? Colon cancer Neg Hx   ? ? ?Social History  ? ?Socioeconomic History  ? Marital status: Widowed  ?  Spouse name: Not on file  ? Number of children: Not on file  ? Years of education: Not on file  ? Highest education level: Not on file  ?Occupational History  ? Not on file  ?Tobacco Use  ? Smoking status: Every Day  ?  Packs/day: 0.50  ?  Types: Cigarettes  ? Smokeless tobacco: Never  ?Vaping Use  ? Vaping Use: Never used  ?Substance and Sexual Activity  ? Alcohol use: Yes  ?  Alcohol/week: 12.0 standard drinks  ?  Types: 12  Standard drinks or equivalent per week  ?  Comment: 2 bottles of wine a week.  ? Drug use: No  ? Sexual activity: Yes  ?  Partners: Male  ?  Birth control/protection: Surgical, Post-menopausal  ?  Comment: BTSP  ?Other Topics Concern  ? Not on file  ?Social History Narrative  ? Not on file  ? ?Social Determinants of Health  ? ?Financial Resource Strain: Not on file  ?Food Insecurity: Not on file  ?Transportation Needs: Not on file  ?Physical Activity: Not on file  ?Stress: Not on file  ?Social Connections: Not on file  ?Intimate Partner Violence: Not on file  ? ? ?Review of Systems: ?Gen: Denies any fever, chills, cold or flu like symptoms, pre-syncope, or syncope.  ?CV: Denies chest pain or heart palpitations. ?Resp: Denies shortness of breath or cough.  ?GI: See HPI ?GU : Denies urinary burning, urinary frequency, urinary hesitancy. ?MS: Denies joint pain. ?Derm: Denies rash. ?Psych: Denies depression, anxiety. ?Heme: Denies bruising, bleeding. ? ?Physical Exam: ?BP 122/64   Pulse 72   Temp (!) 97.5 ?F (36.4 ?C) (Temporal)   Ht '5\' 5"'$  (1.651 m)   Wt 147 lb 3.2 oz (66.8 kg)   LMP 02/06/2018 (Exact Date)   BMI 24.50 kg/m?  ?General:   Alert and oriented. Pleasant and cooperative. Well-nourished and well-developed.  ?Head:  Normocephalic and atraumatic. ?Eyes:  Without icterus, sclera clear and conjunctiva pink.  ?Ears:  Normal auditory acuity. ?Lungs:  Clear to auscultation bilaterally. No wheezes, rales, or rhonchi. No distress.  ?Heart:  S1, S2 present without murmurs appreciated.  ?Abdomen:  +BS, soft, non-tender and non-distended. No HSM noted. No guarding or rebound. No masses appreciated.  ?Rectal:  Deferred  ?Msk:  Symmetrical without gross deformities. Normal posture. ?Extremities:  Without edema. ?Neurologic:  Alert and  oriented x4;  grossly normal neurologically. ?Skin:  Intact without significant lesions or rashes. ?Psych:  Normal mood and affect. ? ? ? ?Assessment:  ?66 year old female with history  of IBS-C, gastritis on EGD in 2011, presenting today to discuss scheduling screening colonoscopy.  Her last colonoscopy was in 2011 with slightly tortuous colon, multiple sigmoid colon diverticula.  Clinically, she is doing well at this time with no significant GI symptoms.  She did have an episode of acute onset lower abdominal pain for 5 days in the latter part of March/April that self resolved, possibly  secondary to acute gastroenteritis as she did have a little diarrhea during that time.  No alarm symptoms.  No family history of colon cancer. ? ? ?Plan:  ?Proceed with colonoscopy with propofol by Dr. Abbey Chatters in near future. The risks, benefits, and alternatives have been discussed with the patient in detail. The patient states understanding and desires to proceed. ?ASA 2 ?Follow-up as needed ? ? ?Aliene Altes, PA-C ?[facility] ?09/30/2021 ?  ?

## 2021-09-28 NOTE — H&P (View-Only) (Signed)
Referring Provider: Sharilyn Sites, MD Primary Care Physician:  Sharilyn Sites, MD Primary Gastroenterologist:  Dr. Abbey Chatters  Chief Complaint  Patient presents with   Colonoscopy    Abdominal pain      HPI:   [name] is a 66 y.o. female with history of IBS-C, gastritis on EGD in 2011, presenting today at the request of Dr. Hilma Favors for lower abdominal pain.  We have not seen patient since December 2011.  EGD 07/28/2009: Patent Schatzki's ring, patchy erythema the body and antrum with occasional erosion biopsied.  Pathology with mild chronic gastritis, negative for H. pylori.  Colonoscopy 07/28/2009: Slightly tortuous colon multiple sigmoid colon diverticula.  Today: Acute onset abdominal pain in the latter part of March or April that started at her bellybutton and went straight down.  Symptoms lasted for 5 days, then resolved.  Reports she did have a little diarrhea during this time, but it resolved.  Never had nausea, vomiting, or fever.  Bowels have returned to normal.  Denies BRBPR, melena, unintentional weight loss.  Occasional indigestion, but nothing routine.  No dysphagia.   Past Medical History:  Diagnosis Date   Abdominal pain, epigastric    Abnormal Pap smear of cervix    11-01-14 LGSIL   Arthritis    in back   Asthma    as a child   Constipation    COVID-19 virus infection 08/2020   History of Salmonella infection    HSV infection    Irritable bowel syndrome    Melanoma of skin, site unspecified    Rotator cuff syndrome of left shoulder    Seasonal allergies    Shoulder pain     Past Surgical History:  Procedure Laterality Date   COLPOSCOPY  2016   DILATION AND CURETTAGE OF UTERUS  1997   bleeding   MOLE REMOVAL     cancer   SKIN GRAFT     Thermal Ablation     of the uterus   TUBAL LIGATION      Current Outpatient Medications  Medication Sig Dispense Refill   Cyanocobalamin (VITAMIN B-12) 2500 MCG SUBL Place 5,000 mcg under the tongue daily.      EPINEPHrine 0.3 mg/0.3 mL IJ SOAJ injection Inject into the muscle.     fluticasone (FLONASE) 50 MCG/ACT nasal spray daily.     levocetirizine (XYZAL) 5 MG tablet at bedtime.     montelukast (SINGULAIR) 10 MG tablet as needed.      progesterone (PROMETRIUM) 100 MG capsule      UNABLE TO FIND Allergy shots     UNABLE TO FIND Estrogen/testosterone pellet     UNABLE TO FIND Med Name:Venom injections     valACYclovir (VALTREX) 500 MG tablet Take one tablet (500 mg) by mouth daily for prevention.  Take one tablet by mouth twice a day for 3 days for an outbreak. 110 tablet 3   No current facility-administered medications for this visit.    Allergies as of 09/30/2021 - Review Complete 09/30/2021  Allergen Reaction Noted   Bee venom Anaphylaxis 12/24/2019    Family History  Problem Relation Age of Onset   Hypertension Mother    Heart attack Mother    Fibroids Mother        h/x hysterectomy   Hypothyroidism Mother    Diabetes Mother    Acromegaly Mother    Alzheimer's disease Mother    Hypertension Father    Kidney Stones Father    Diabetes Father  Heart block Father        stint put in   Leukemia Brother    Diabetes Maternal Grandmother    Heart failure Maternal Grandmother    Depression Maternal Grandfather    Parkinson's disease Maternal Grandfather    Cancer Maternal Grandfather        brain   Breast cancer Paternal Aunt    Breast cancer Paternal Aunt    Colon cancer Neg Hx     Social History   Socioeconomic History   Marital status: Widowed    Spouse name: Not on file   Number of children: Not on file   Years of education: Not on file   Highest education level: Not on file  Occupational History   Not on file  Tobacco Use   Smoking status: Every Day    Packs/day: 0.50    Types: Cigarettes   Smokeless tobacco: Never  Vaping Use   Vaping Use: Never used  Substance and Sexual Activity   Alcohol use: Yes    Alcohol/week: 12.0 standard drinks    Types: 12  Standard drinks or equivalent per week    Comment: 2 bottles of wine a week.   Drug use: No   Sexual activity: Yes    Partners: Male    Birth control/protection: Surgical, Post-menopausal    Comment: BTSP  Other Topics Concern   Not on file  Social History Narrative   Not on file   Social Determinants of Health   Financial Resource Strain: Not on file  Food Insecurity: Not on file  Transportation Needs: Not on file  Physical Activity: Not on file  Stress: Not on file  Social Connections: Not on file  Intimate Partner Violence: Not on file    Review of Systems: Gen: Denies any fever, chills, cold or flu like symptoms, pre-syncope, or syncope.  CV: Denies chest pain or heart palpitations. Resp: Denies shortness of breath or cough.  GI: See HPI GU : Denies urinary burning, urinary frequency, urinary hesitancy. MS: Denies joint pain. Derm: Denies rash. Psych: Denies depression, anxiety. Heme: Denies bruising, bleeding.  Physical Exam: BP 122/64   Pulse 72   Temp (!) 97.5 F (36.4 C) (Temporal)   Ht '5\' 5"'$  (1.651 m)   Wt 147 lb 3.2 oz (66.8 kg)   LMP 02/06/2018 (Exact Date)   BMI 24.50 kg/m  General:   Alert and oriented. Pleasant and cooperative. Well-nourished and well-developed.  Head:  Normocephalic and atraumatic. Eyes:  Without icterus, sclera clear and conjunctiva pink.  Ears:  Normal auditory acuity. Lungs:  Clear to auscultation bilaterally. No wheezes, rales, or rhonchi. No distress.  Heart:  S1, S2 present without murmurs appreciated.  Abdomen:  +BS, soft, non-tender and non-distended. No HSM noted. No guarding or rebound. No masses appreciated.  Rectal:  Deferred  Msk:  Symmetrical without gross deformities. Normal posture. Extremities:  Without edema. Neurologic:  Alert and  oriented x4;  grossly normal neurologically. Skin:  Intact without significant lesions or rashes. Psych:  Normal mood and affect.    Assessment:  66 year old female with history  of IBS-C, gastritis on EGD in 2011, presenting today to discuss scheduling screening colonoscopy.  Her last colonoscopy was in 2011 with slightly tortuous colon, multiple sigmoid colon diverticula.  Clinically, she is doing well at this time with no significant GI symptoms.  She did have an episode of acute onset lower abdominal pain for 5 days in the latter part of March/April that self resolved, possibly  secondary to acute gastroenteritis as she did have a little diarrhea during that time.  No alarm symptoms.  No family history of colon cancer.   Plan:  Proceed with colonoscopy with propofol by Dr. Abbey Chatters in near future. The risks, benefits, and alternatives have been discussed with the patient in detail. The patient states understanding and desires to proceed. ASA 2 Follow-up as needed   Aliene Altes, [facility] 09/30/2021

## 2021-09-30 ENCOUNTER — Encounter: Payer: Self-pay | Admitting: Gastroenterology

## 2021-09-30 ENCOUNTER — Other Ambulatory Visit: Payer: Self-pay

## 2021-09-30 ENCOUNTER — Ambulatory Visit: Payer: BC Managed Care – PPO | Admitting: Gastroenterology

## 2021-09-30 VITALS — BP 122/64 | HR 72 | Temp 97.5°F | Ht 65.0 in | Wt 147.2 lb

## 2021-09-30 DIAGNOSIS — Z1211 Encounter for screening for malignant neoplasm of colon: Secondary | ICD-10-CM | POA: Diagnosis not present

## 2021-09-30 MED ORDER — PEG 3350-KCL-NA BICARB-NACL 420 G PO SOLR: 4000.0000 mL | ORAL | 0 refills | Status: DC

## 2021-09-30 NOTE — Patient Instructions (Addendum)
We will arrange for you to have a colonoscopy in the near future with Dr. Abbey Chatters. ? ?We will see you back in the office as needed.  Do not hesitate to call if you have any GI concerns. ? ?It was nice meeting you today! ? ?Aliene Altes, PA-C ?[facility] ? ?

## 2021-10-05 DIAGNOSIS — J3089 Other allergic rhinitis: Secondary | ICD-10-CM | POA: Diagnosis not present

## 2021-10-05 DIAGNOSIS — J301 Allergic rhinitis due to pollen: Secondary | ICD-10-CM | POA: Diagnosis not present

## 2021-10-05 DIAGNOSIS — J3081 Allergic rhinitis due to animal (cat) (dog) hair and dander: Secondary | ICD-10-CM | POA: Diagnosis not present

## 2021-10-12 DIAGNOSIS — J3081 Allergic rhinitis due to animal (cat) (dog) hair and dander: Secondary | ICD-10-CM | POA: Diagnosis not present

## 2021-10-12 DIAGNOSIS — J301 Allergic rhinitis due to pollen: Secondary | ICD-10-CM | POA: Diagnosis not present

## 2021-10-12 DIAGNOSIS — J3089 Other allergic rhinitis: Secondary | ICD-10-CM | POA: Diagnosis not present

## 2021-10-21 ENCOUNTER — Ambulatory Visit
Admission: EM | Admit: 2021-10-21 | Discharge: 2021-10-21 | Disposition: A | Discharge status: Home / Self Care | Payer: BC Managed Care – PPO | Attending: Nurse Practitioner | Admitting: Nurse Practitioner

## 2021-10-21 ENCOUNTER — Ambulatory Visit (INDEPENDENT_AMBULATORY_CARE_PROVIDER_SITE_OTHER): Payer: BC Managed Care – PPO

## 2021-10-21 DIAGNOSIS — R059 Cough, unspecified: Secondary | ICD-10-CM

## 2021-10-21 DIAGNOSIS — R0602 Shortness of breath: Secondary | ICD-10-CM

## 2021-10-21 DIAGNOSIS — J209 Acute bronchitis, unspecified: Secondary | ICD-10-CM | POA: Diagnosis not present

## 2021-10-21 DIAGNOSIS — R062 Wheezing: Secondary | ICD-10-CM

## 2021-10-21 MED ORDER — PROMETHAZINE-DM 6.25-15 MG/5ML PO SYRP: 5.0000 mL | ORAL_SOLUTION | Freq: Four times a day (QID) | ORAL | 0 refills | Status: DC | PRN

## 2021-10-21 MED ORDER — PREDNISONE 20 MG PO TABS: 40.0000 mg | ORAL_TABLET | Freq: Every day | ORAL | 0 refills | Status: AC

## 2021-10-21 MED ORDER — ALBUTEROL SULFATE (2.5 MG/3ML) 0.083% IN NEBU
2.5000 mg | INHALATION_SOLUTION | RESPIRATORY_TRACT | Status: AC
  Administered 2021-10-21: 2.5 mg via RESPIRATORY_TRACT

## 2021-10-21 MED ORDER — ALBUTEROL SULFATE HFA 108 (90 BASE) MCG/ACT IN AERS: 2.0000 | INHALATION_SPRAY | Freq: Four times a day (QID) | RESPIRATORY_TRACT | 0 refills | Status: AC | PRN

## 2021-10-21 MED ORDER — AZITHROMYCIN 250 MG PO TABS: 250.0000 mg | ORAL_TABLET | Freq: Every day | ORAL | 0 refills | Status: DC

## 2021-10-21 MED ORDER — METHYLPREDNISOLONE SODIUM SUCC 125 MG IJ SOLR
80.0000 mg | Freq: Once | INTRAMUSCULAR | Status: AC
  Administered 2021-10-21: 80 mg via INTRAMUSCULAR

## 2021-10-21 NOTE — Discharge Instructions (Addendum)
Take medication as prescribed. Increase fluids and allow for plenty of rest. Recommend humidifier at bedtime to help with cough during sleep. Sleep elevated on 2 pillows. Take over-the-counter ibuprofen or Tylenol as needed for pain or fever. Your x-rays were negative today for pneumonia.  They do show changes related to bronchitis. Do not smoke while your symptoms persist.  Recommend considering smoking cessation in the future. I would like for you to follow-up with your primary care doctor within the next 7 to 10 days for follow-up.

## 2021-10-21 NOTE — ED Triage Notes (Addendum)
Pt reports congestion, headache, fever 100.5 F, wheezing ad shortness of breath x 2 days' headache and bilateral ear pain x 1 day. NyQuil gives no relief.   Negative COVID home test on 10/20/2021.

## 2021-10-21 NOTE — ED Provider Notes (Signed)
[facility]    CSN: [id number] Arrival date & time: 10/21/21  2836      History   Chief Complaint No chief complaint on file.   HPI [name] is a 66 y.o. female.   HPI Patient presents with wheezing, shortness of breath, and cough, this been present for the past 2 days.  She also complains of chest congestion, headache, and bilateral ear pain.  Patient states that she has also had a fever of 100.5.  She states the wheezing and shortness of breath worsened last evening.  She states that she does currently smoke, and was previously diagnosed with COPD.  In a home COVID test, which was negative on yesterday.  Past Medical History:  Diagnosis Date   Abdominal pain, epigastric    Abnormal Pap smear of cervix    11-01-14 LGSIL   Arthritis    in back   Asthma    as a child   Constipation    COVID-19 virus infection 08/2020   History of Salmonella infection    HSV infection    Irritable bowel syndrome    Melanoma of skin, site unspecified    Rotator cuff syndrome of left shoulder    Seasonal allergies    Shoulder pain     Patient Active Problem List   Diagnosis Date Noted   Colon cancer screening 09/30/2021   History of Salmonella infection 07/10/2009   MELANOMA OF SKIN, SITE UNSPECIFIED 07/10/2009   CONSTIPATION 07/10/2009   ABDOMINAL PAIN, EPIGASTRIC 07/10/2009   IRRITABLE BOWEL SYNDROME, HX OF 07/10/2009   SHOULDER PAIN 06/07/2007   ROTATOR CUFF SYNDROME, LEFT 06/07/2007    Past Surgical History:  Procedure Laterality Date   COLPOSCOPY  2016   DILATION AND CURETTAGE OF UTERUS  1997   bleeding   MOLE REMOVAL     cancer   SKIN GRAFT     Thermal Ablation     of the uterus   TUBAL LIGATION      OB History     Gravida  0   Para  0   Term  0   Preterm  0   AB  0   Living  0      SAB  0   IAB  0   Ectopic  0   Multiple  0   Live Births               Home Medications    Prior to Admission medications   Medication  Sig Start Date End Date Taking? Authorizing Provider  albuterol (VENTOLIN HFA) 108 (90 Base) MCG/ACT inhaler Inhale 2 puffs into the lungs every 6 (six) hours as needed for wheezing or shortness of breath. 10/21/21  Yes Jadea Shiffer-Warren, Alda Lea, NP  azithromycin (ZITHROMAX) 250 MG tablet Take 1 tablet (250 mg total) by mouth daily. Take first 2 tablets together, then 1 every day until finished. 10/21/21  Yes Kamron Portee-Warren, Alda Lea, NP  predniSONE (DELTASONE) 20 MG tablet Take 2 tablets (40 mg total) by mouth daily with breakfast for 5 days. 10/21/21 10/26/21 Yes Geovany Trudo-Warren, Alda Lea, NP  promethazine-dextromethorphan (PROMETHAZINE-DM) 6.25-15 MG/5ML syrup Take 5 mLs by mouth 4 (four) times daily as needed for cough. 10/21/21  Yes Larysa Pall-Warren, Alda Lea, NP  Cyanocobalamin (VITAMIN B-12) 2500 MCG SUBL Place 5,000 mcg under the tongue daily.    [provider]  EPINEPHrine 0.3 mg/0.3 mL IJ SOAJ injection Inject into the muscle. 11/28/19   [provider]  fluticasone (  FLONASE) 50 MCG/ACT nasal spray daily. 02/23/18   [provider]  levocetirizine (XYZAL) 5 MG tablet at bedtime. 02/23/18   [provider]  montelukast (SINGULAIR) 10 MG tablet as needed.  10/28/14   [provider]  polyethylene glycol-electrolytes (TRILYTE) 420 g solution Take 4,000 mLs by mouth as directed. 09/30/21   Eloise Harman, DO  progesterone (PROMETRIUM) 100 MG capsule  10/10/17   [provider]  UNABLE TO FIND Allergy shots    [provider]  UNABLE TO FIND Estrogen/testosterone pellet    [provider]  UNABLE TO FIND Med Name:Venom injections    [provider]  valACYclovir (VALTREX) 500 MG tablet Take one tablet (500 mg) by mouth daily for prevention.  Take one tablet by mouth twice a day for 3 days for an outbreak. 12/25/20   Nunzio Cobbs, MD    Family History Family History  Problem Relation Age of Onset   Hypertension  Mother    Heart attack Mother    Fibroids Mother        h/x hysterectomy   Hypothyroidism Mother    Diabetes Mother    Acromegaly Mother    Alzheimer's disease Mother    Hypertension Father    Kidney Stones Father    Diabetes Father    Heart block Father        stint put in   Leukemia Brother    Diabetes Maternal Grandmother    Heart failure Maternal Grandmother    Depression Maternal Grandfather    Parkinson's disease Maternal Grandfather    Cancer Maternal Grandfather        brain   Breast cancer Paternal Aunt    Breast cancer Paternal Aunt    Colon cancer Neg Hx     Social History Social History   Tobacco Use   Smoking status: Every Day    Packs/day: 0.50    Types: Cigarettes   Smokeless tobacco: Never  Vaping Use   Vaping Use: Never used  Substance Use Topics   Alcohol use: Yes    Alcohol/week: 12.0 standard drinks    Types: 12 Standard drinks or equivalent per week    Comment: 2 bottles of wine a week.   Drug use: No     Allergies   Bee venom   Review of Systems Review of Systems PER HPI  Physical Exam Triage Vital Signs ED Triage Vitals  Enc Vitals Group     BP 10/21/21 1015 114/65     Pulse Rate 10/21/21 1015 70     Resp 10/21/21 1015 (!) 22     Temp 10/21/21 1015 98 F (36.7 C)     Temp Source 10/21/21 1015 Oral     SpO2 10/21/21 1015 94 %     Weight --      Height --      Head Circumference --      Peak Flow --      Pain Score 10/21/21 1014 4     Pain Loc --      Pain Edu? --      Excl. in [place]? --    No data found.  Updated Vital Signs BP 114/65 (BP Location: Right Arm)   Pulse 70   Temp 98 F (36.7 C) (Oral)   Resp (!) 22   LMP 02/06/2018 (Exact Date)   SpO2 94%   Visual Acuity Right Eye Distance:   Left Eye Distance:   Bilateral Distance:  Right Eye Near:   Left Eye Near:    Bilateral Near:     Physical Exam Vitals and nursing note reviewed.  Constitutional:      General: She is not in acute distress.     Appearance: Normal appearance.  HENT:     Right Ear: Tympanic membrane, ear canal and external ear normal.     Left Ear: Tympanic membrane, ear canal and external ear normal.     Nose: Congestion present.     Mouth/Throat:     Mouth: Mucous membranes are moist.     Pharynx: Posterior oropharyngeal erythema present. No oropharyngeal exudate.  Eyes:     Extraocular Movements: Extraocular movements intact.     Conjunctiva/sclera: Conjunctivae normal.     Pupils: Pupils are equal, round, and reactive to light.  Cardiovascular:     Rate and Rhythm: Normal rate and regular rhythm.     Pulses: Normal pulses.     Heart sounds: Normal heart sounds.  Pulmonary:     Effort: Pulmonary effort is normal.     Breath sounds: No decreased air movement. Examination of the right-upper field reveals wheezing. Examination of the left-upper field reveals wheezing. Examination of the right-lower field reveals wheezing and rhonchi. Examination of the left-lower field reveals wheezing and rhonchi. Wheezing and rhonchi present. No rales.     Comments: Inspiratory/expiratory wheezing noted throughout.  Post nebulizer treatment wheezing decreased, expiratory wheezing present. Patient is speaking in complete sentences, is in NAD. Abdominal:     General: Bowel sounds are normal.     Palpations: Abdomen is soft.  Musculoskeletal:     Cervical back: Normal range of motion.  Skin:    General: Skin is warm and dry.     Capillary Refill: Capillary refill takes less than 2 seconds.  Neurological:     General: No focal deficit present.     Mental Status: She is alert and oriented to person, place, and time.  Psychiatric:        Mood and Affect: Mood normal.        Behavior: Behavior normal.     UC Treatments / Results  Labs (all labs ordered are listed, but only abnormal results are displayed) Labs Reviewed - No data to display  EKG   Radiology DG Chest 2 View  Result Date: 10/21/2021 CLINICAL DATA:   Wheezing and shortness of breath. Cough. Smoking history. EXAM: CHEST - 2 VIEW COMPARISON:  06/30/2017 FINDINGS: Heart size is normal. Mediastinal shadows are normal. There is central bronchial thickening but no infiltrate, collapse or effusion. No significant bone finding. IMPRESSION: Bronchitis pattern.  No consolidation or collapse. Electronically Signed   By: Nelson Chimes M.D.   On: 10/21/2021 10:27    Procedures Procedures (including critical care time)  Medications Ordered in UC Medications  albuterol (PROVENTIL) (2.5 MG/3ML) 0.083% nebulizer solution 2.5 mg (2.5 mg Nebulization Given 10/21/21 1046)  methylPREDNISolone sodium succinate (SOLU-MEDROL) 125 mg/2 mL injection 80 mg (80 mg Intramuscular Given 10/21/21 1046)    Initial Impression / Assessment and Plan / UC Course  I have reviewed the triage vital signs and the nursing notes.  Pertinent labs & imaging results that were available during my care of the patient were reviewed by me and considered in my medical decision making (see chart for details).  Patient presents with wheezing, shortness of breath, and cough for the past 2 days.  On exam, patient had audible expiratory/inspiratory wheezing.  Nebulizer treatment was given, continued to experience  expiratory wheezing, she is in no acute distress, and is speaking in complete sentences.  Also administered Solu-Medrol 80 mg.  Her x-ray showed irritation of her bronchioles.  Consistent with bronchitis.  Patient also has a history of COPD and a smoking history.  Symptoms are also consistent with a COPD exacerbation.  Patient was advised to refrain from smoking while she is having symptoms.  Would like for her to follow-up with her primary care within the next 7 to 10 days.  Patient was given azithromycin, albuterol inhaler, prednisone, and Promethazine DM for her symptoms.  Patient was given strict return precautions.  Patient advised to follow-up if her symptoms worsen or do not  improve. Final Clinical Impressions(s) / UC Diagnoses   Final diagnoses:  Acute bronchitis, unspecified organism  Wheezing     Discharge Instructions      Take medication as prescribed. Increase fluids and allow for plenty of rest. Recommend humidifier at bedtime to help with cough during sleep. Sleep elevated on 2 pillows. Take over-the-counter ibuprofen or Tylenol as needed for pain or fever. Your x-rays were negative today for pneumonia.  They do show changes related to bronchitis. Do not smoke while your symptoms persist.  Recommend considering smoking cessation in the future. I would like for you to follow-up with your primary care doctor within the next 7 to 10 days for follow-up.     ED Prescriptions     Medication Sig Dispense Auth. Provider   albuterol (VENTOLIN HFA) 108 (90 Base) MCG/ACT inhaler Inhale 2 puffs into the lungs every 6 (six) hours as needed for wheezing or shortness of breath. 8 g Siddh Vandeventer-Warren, Alda Lea, NP   predniSONE (DELTASONE) 20 MG tablet Take 2 tablets (40 mg total) by mouth daily with breakfast for 5 days. 10 tablet Kataleya Zaugg-Warren, Alda Lea, NP   azithromycin (ZITHROMAX) 250 MG tablet Take 1 tablet (250 mg total) by mouth daily. Take first 2 tablets together, then 1 every day until finished. 6 tablet [place]-Warren, Alda Lea, NP   promethazine-dextromethorphan (PROMETHAZINE-DM) 6.25-15 MG/5ML syrup Take 5 mLs by mouth 4 (four) times daily as needed for cough. 140 mL Petra Dumler-Warren, Alda Lea, NP      PDMP not reviewed this encounter.   Tish Men, NP 10/21/21 1102

## 2021-10-23 DIAGNOSIS — J3081 Allergic rhinitis due to animal (cat) (dog) hair and dander: Secondary | ICD-10-CM | POA: Diagnosis not present

## 2021-10-23 DIAGNOSIS — J301 Allergic rhinitis due to pollen: Secondary | ICD-10-CM | POA: Diagnosis not present

## 2021-10-23 DIAGNOSIS — J3089 Other allergic rhinitis: Secondary | ICD-10-CM | POA: Diagnosis not present

## 2021-10-26 ENCOUNTER — Telehealth: Payer: Self-pay

## 2021-10-26 DIAGNOSIS — T63451D Toxic effect of venom of hornets, accidental (unintentional), subsequent encounter: Secondary | ICD-10-CM | POA: Diagnosis not present

## 2021-10-26 DIAGNOSIS — T63461D Toxic effect of venom of wasps, accidental (unintentional), subsequent encounter: Secondary | ICD-10-CM | POA: Diagnosis not present

## 2021-10-26 DIAGNOSIS — T63441D Toxic effect of venom of bees, accidental (unintentional), subsequent encounter: Secondary | ICD-10-CM | POA: Diagnosis not present

## 2021-10-26 MED ORDER — PEG 3350-KCL-NA BICARB-NACL 420 G PO SOLR: 4000.0000 mL | ORAL | 0 refills | Status: DC

## 2021-10-26 NOTE — Telephone Encounter (Signed)
Pt LMOVM, pharmacy told her they didn't have rx for TCS prep.   Called pt, informed her rx for prep was sent to pharmacy 09/30/21. Resent rx.

## 2021-10-28 ENCOUNTER — Ambulatory Visit (HOSPITAL_COMMUNITY): Payer: BC Managed Care – PPO

## 2021-10-30 ENCOUNTER — Encounter (HOSPITAL_COMMUNITY): Payer: Self-pay

## 2021-10-30 ENCOUNTER — Ambulatory Visit (HOSPITAL_COMMUNITY)
Admission: RE | Admit: 2021-10-30 | Discharge: 2021-10-30 | Disposition: A | Discharge status: Home / Self Care | Payer: BC Managed Care – PPO | Source: Ambulatory Visit | Attending: Internal Medicine | Admitting: Internal Medicine

## 2021-10-30 ENCOUNTER — Other Ambulatory Visit: Payer: Self-pay

## 2021-10-30 ENCOUNTER — Encounter (HOSPITAL_COMMUNITY)
Admission: RE | Disposition: A | Discharge status: Home / Self Care | Payer: Self-pay | Source: Ambulatory Visit | Attending: Internal Medicine

## 2021-10-30 ENCOUNTER — Ambulatory Visit (HOSPITAL_COMMUNITY): Payer: BC Managed Care – PPO | Admitting: Anesthesiology

## 2021-10-30 DIAGNOSIS — K635 Polyp of colon: Secondary | ICD-10-CM | POA: Diagnosis not present

## 2021-10-30 DIAGNOSIS — F17210 Nicotine dependence, cigarettes, uncomplicated: Secondary | ICD-10-CM | POA: Diagnosis not present

## 2021-10-30 DIAGNOSIS — K5730 Diverticulosis of large intestine without perforation or abscess without bleeding: Secondary | ICD-10-CM | POA: Insufficient documentation

## 2021-10-30 DIAGNOSIS — K648 Other hemorrhoids: Secondary | ICD-10-CM | POA: Diagnosis not present

## 2021-10-30 DIAGNOSIS — Z1211 Encounter for screening for malignant neoplasm of colon: Secondary | ICD-10-CM | POA: Diagnosis not present

## 2021-10-30 DIAGNOSIS — J45909 Unspecified asthma, uncomplicated: Secondary | ICD-10-CM | POA: Diagnosis not present

## 2021-10-30 HISTORY — DX: Nausea with vomiting, unspecified: R11.2

## 2021-10-30 HISTORY — PX: POLYPECTOMY: SHX5525

## 2021-10-30 HISTORY — DX: Nausea with vomiting, unspecified: Z98.890

## 2021-10-30 HISTORY — PX: COLONOSCOPY WITH PROPOFOL: SHX5780

## 2021-10-30 SURGERY — COLONOSCOPY WITH PROPOFOL
Anesthesia: General

## 2021-10-30 MED ORDER — LACTATED RINGERS IV SOLN: INTRAVENOUS | Status: DC

## 2021-10-30 MED ORDER — PROPOFOL 10 MG/ML IV BOLUS
INTRAVENOUS | Status: DC | PRN
  Administered 2021-10-30: 80 mg via INTRAVENOUS

## 2021-10-30 MED ORDER — LIDOCAINE HCL (CARDIAC) PF 100 MG/5ML IV SOSY
PREFILLED_SYRINGE | INTRAVENOUS | Status: DC | PRN
  Administered 2021-10-30: 30 mg via INTRATRACHEAL

## 2021-10-30 MED ORDER — PROPOFOL 500 MG/50ML IV EMUL
INTRAVENOUS | Status: DC | PRN
  Administered 2021-10-30: 180 ug/kg/min via INTRAVENOUS

## 2021-10-30 MED ORDER — STERILE WATER FOR IRRIGATION IR SOLN
Status: DC | PRN
  Administered 2021-10-30: 60 mL

## 2021-10-30 NOTE — Op Note (Signed)
[facility] Patient Name: [name] Procedure Date: 10/30/2021 9:46 AM MRN: [id number] Date of Birth: 1955-08-25 Attending MD: [name] CSN: [id number] Age: 66 Admit Type: Outpatient Procedure:                Colonoscopy Indications:              Screening for colorectal malignant neoplasm Providers:                [name], [name], [name],                            Technician Referring MD:              Medicines:                See the Anesthesia note for documentation of the                            administered medications Complications:            No immediate complications. Estimated Blood Loss:     Estimated blood loss was minimal. Procedure:                Pre-Anesthesia Assessment:                           - The anesthesia plan was to use monitored                            anesthesia care (MAC).                           After obtaining informed consent, the colonoscope                            was passed under direct vision. Throughout the                            procedure, the patient's blood pressure, pulse, and                            oxygen saturations were monitored continuously. The                            [id number]-HQ190L ([id number]) scope was introduced through                            the anus and advanced to the the cecum, identified                            by appendiceal orifice and ileocecal valve. The                            colonoscopy was performed without difficulty. The                            patient tolerated the procedure well. The quality  of the bowel preparation was evaluated using the                            BBPS [facility] Bowel Preparation Scale) with scores                            of: Right Colon = 3, Transverse Colon = 3 and Left                            Colon = 3 (entire mucosa seen well with no residual                            staining, small  fragments of stool or opaque                            liquid). The total BBPS score equals 9. Scope In: 9:58:32 AM Scope Out: 10:09:10 AM Scope Withdrawal Time: 0 hours 7 minutes 34 seconds  Total Procedure Duration: 0 hours 10 minutes 38 seconds  Findings:      The perianal and digital rectal examinations were normal.      Non-bleeding internal hemorrhoids were found during endoscopy.      Multiple small-mouthed diverticula were found in the sigmoid colon.      Two sessile polyps were found in the ascending colon and cecum. The       polyps were 2 mm in size. These polyps were removed with a cold biopsy       forceps. Resection and retrieval were complete.      The exam was otherwise without abnormality. Impression:               - Non-bleeding internal hemorrhoids.                           - Diverticulosis in the sigmoid colon.                           - Two 2 mm polyps in the ascending colon and in the                            cecum, removed with a cold biopsy forceps. Resected                            and retrieved.                           - The examination was otherwise normal. Moderate Sedation:      Per Anesthesia Care Recommendation:           - Patient has a contact number available for                            emergencies. The signs and symptoms of potential                            delayed complications were discussed with the  patient. Return to normal activities tomorrow.                            Written discharge instructions were provided to the                            patient.                           - Resume previous diet.                           - Continue present medications.                           - Await pathology results.                           - Repeat colonoscopy in 5-10 years for surveillance.                           - Return to GI clinic PRN. Procedure Code(s):        --- Professional ---                            [phone], Colonoscopy, flexible; with biopsy, single                            or multiple Diagnosis Code(s):        --- Professional ---                           Z12.11, Encounter for screening for malignant                            neoplasm of colon                           K64.8, Other hemorrhoids                           K63.5, Polyp of colon                           K57.30, Diverticulosis of large intestine without                            perforation or abscess without bleeding CPT copyright 2019 [employer]. All rights reserved. The codes documented in this report are preliminary and upon coder review may  be revised to meet current compliance requirements. [name] [name] 10/30/2021 10:11:15 AM This report has been signed electronically. Number of Addenda: 0

## 2021-10-30 NOTE — Anesthesia Preprocedure Evaluation (Signed)
Anesthesia Evaluation  Patient identified by MRN, date of birth, ID band Patient awake    Reviewed: Allergy & Precautions, H&P , NPO status , Patient's Chart, lab work & pertinent test results, reviewed documented beta blocker date and time   History of Anesthesia Complications (+) PONV and history of anesthetic complications  Airway Mallampati: II  TM Distance: >3 FB Neck ROM: full    Dental no notable dental hx.    Pulmonary asthma , Current Smoker,    Pulmonary exam normal breath sounds clear to auscultation       Cardiovascular Exercise Tolerance: Good negative cardio ROS   Rhythm:regular Rate:Normal     Neuro/Psych negative neurological ROS  negative psych ROS   GI/Hepatic negative GI ROS, Neg liver ROS,   Endo/Other  negative endocrine ROS  Renal/GU negative Renal ROS  negative genitourinary   Musculoskeletal   Abdominal   Peds  Hematology negative hematology ROS (+)   Anesthesia Other Findings   Reproductive/Obstetrics negative OB ROS                             Anesthesia Physical Anesthesia Plan  ASA: 2  Anesthesia Plan: General   Post-op Pain Management:    Induction:   PONV Risk Score and Plan: Propofol infusion  Airway Management Planned:   Additional Equipment:   Intra-op Plan:   Post-operative Plan:   Informed Consent: I have reviewed the patients History and Physical, chart, labs and discussed the procedure including the risks, benefits and alternatives for the proposed anesthesia with the patient or authorized representative who has indicated his/her understanding and acceptance.     Dental Advisory Given  Plan Discussed with: CRNA  Anesthesia Plan Comments:         Anesthesia Quick Evaluation

## 2021-10-30 NOTE — Interval H&P Note (Signed)
History and Physical Interval Note:  10/30/2021 9:23 AM  [name]  has presented today for surgery, with the diagnosis of colon cancer screening.  The various methods of treatment have been discussed with the patient and family. After consideration of risks, benefits and other options for treatment, the patient has consented to  Procedure(s) with comments: COLONOSCOPY WITH PROPOFOL (N/A) - 10:30am as a surgical intervention.  The patient's history has been reviewed, patient examined, no change in status, stable for surgery.  I have reviewed the patient's chart and labs.  Questions were answered to the patient's satisfaction.     Eloise Harman

## 2021-10-30 NOTE — Anesthesia Postprocedure Evaluation (Signed)
Anesthesia Post Note  Patient: [name]  Procedure(s) Performed: COLONOSCOPY WITH PROPOFOL POLYPECTOMY  Patient location during evaluation: Phase II Anesthesia Type: General Level of consciousness: awake Pain management: pain level controlled Vital Signs Assessment: post-procedure vital signs reviewed and stable Respiratory status: spontaneous breathing and respiratory function stable Cardiovascular status: blood pressure returned to baseline and stable Postop Assessment: no headache and no apparent nausea or vomiting Anesthetic complications: no Comments: Late entry   No notable events documented.   Last Vitals:  Vitals:   10/30/21 0921 10/30/21 1012  BP: 123/64 (!) 90/54  Pulse: (!) 49   Resp: 16 11  Temp: 36.6 C 36.5 C  SpO2: 99% 99%    Last Pain:  Vitals:   10/30/21 1012  TempSrc: Oral  PainSc: 0-No pain                 Louann Sjogren

## 2021-10-30 NOTE — Transfer of Care (Signed)
Immediate Anesthesia Transfer of Care Note  Patient: [name]  Procedure(s) Performed: COLONOSCOPY WITH PROPOFOL POLYPECTOMY  Patient Location: PACU  Anesthesia Type:General  Level of Consciousness: awake, alert  and oriented  Airway & Oxygen Therapy: Patient Spontanous Breathing  Post-op Assessment: Report given to RN, Post -op Vital signs reviewed and stable and Patient moving all extremities X 4  Post vital signs: Reviewed and stable  Last Vitals:  Vitals Value Taken Time  BP 90/54 10/30/21 1012  Temp 36.5 C 10/30/21 1012  Pulse    Resp 11 10/30/21 1012  SpO2 99 % 10/30/21 1012    Last Pain:  Vitals:   10/30/21 1012  TempSrc: Oral  PainSc: 0-No pain      Patients Stated Pain Goal: 4 (92/42/68 3419)  Complications: No notable events documented.

## 2021-10-30 NOTE — Discharge Instructions (Addendum)
  Colonoscopy Discharge Instructions  Read the instructions outlined below and refer to this sheet in the next few weeks. These discharge instructions provide you with general information on caring for yourself after you leave the hospital. Your doctor may also give you specific instructions. While your treatment has been planned according to the most current medical practices available, unavoidable complications occasionally occur.   ACTIVITY You may resume your regular activity, but move at a slower pace for the next 24 hours.  Take frequent rest periods for the next 24 hours.  Walking will help get rid of the air and reduce the bloated feeling in your belly (abdomen).  No driving for 24 hours (because of the medicine (anesthesia) used during the test).   Do not sign any important legal documents or operate any machinery for 24 hours (because of the anesthesia used during the test).  NUTRITION Drink plenty of fluids.  You may resume your normal diet as instructed by your doctor.  Begin with a light meal and progress to your normal diet. Heavy or fried foods are harder to digest and may make you feel sick to your stomach (nauseated).  Avoid alcoholic beverages for 24 hours or as instructed.  MEDICATIONS You may resume your normal medications unless your doctor tells you otherwise.  WHAT YOU CAN EXPECT TODAY Some feelings of bloating in the abdomen.  Passage of more gas than usual.  Spotting of blood in your stool or on the toilet paper.  IF YOU HAD POLYPS REMOVED DURING THE COLONOSCOPY: No aspirin products for 7 days or as instructed.  No alcohol for 7 days or as instructed.  Eat a soft diet for the next 24 hours.  FINDING OUT THE RESULTS OF YOUR TEST Not all test results are available during your visit. If your test results are not back during the visit, make an appointment with your caregiver to find out the results. Do not assume everything is normal if you have not heard from your  caregiver or the medical facility. It is important for you to follow up on all of your test results.  SEEK IMMEDIATE MEDICAL ATTENTION IF: You have more than a spotting of blood in your stool.  Your belly is swollen (abdominal distention).  You are nauseated or vomiting.  You have a temperature over 101.  You have abdominal pain or discomfort that is severe or gets worse throughout the day.   Your colonoscopy revealed 2 polyp(s) which I removed successfully. Await pathology results, my office will contact you. I recommend repeating colonoscopy in 5-10 years for surveillance purposes depending on pathology.   You also have diverticulosis and internal hemorrhoids. I would recommend increasing fiber in your diet or adding OTC Benefiber/Metamucil. Be sure to drink at least 4 to 6 glasses of water daily. Follow-up with GI as needed.    I hope you have a great rest of your week!  Elon Alas. Abbey Chatters, D.O. Gastroenterology and Hepatology [facility]

## 2021-11-02 DIAGNOSIS — J3081 Allergic rhinitis due to animal (cat) (dog) hair and dander: Secondary | ICD-10-CM | POA: Diagnosis not present

## 2021-11-02 DIAGNOSIS — J301 Allergic rhinitis due to pollen: Secondary | ICD-10-CM | POA: Diagnosis not present

## 2021-11-02 DIAGNOSIS — J3089 Other allergic rhinitis: Secondary | ICD-10-CM | POA: Diagnosis not present

## 2021-11-02 NOTE — Addendum Note (Signed)
Addendum  created 11/02/21 1357 by Minerva Ends, CRNA   Intraprocedure Staff edited

## 2021-11-03 LAB — SURGICAL PATHOLOGY

## 2021-11-06 ENCOUNTER — Encounter (HOSPITAL_COMMUNITY): Payer: Self-pay | Admitting: Internal Medicine

## 2021-11-09 ENCOUNTER — Ambulatory Visit (HOSPITAL_COMMUNITY)
Admission: RE | Admit: 2021-11-09 | Discharge: 2021-11-09 | Disposition: A | Discharge status: Home / Self Care | Payer: BC Managed Care – PPO | Source: Ambulatory Visit | Attending: Internal Medicine | Admitting: Internal Medicine

## 2021-11-09 DIAGNOSIS — Z87891 Personal history of nicotine dependence: Secondary | ICD-10-CM | POA: Diagnosis not present

## 2021-11-09 DIAGNOSIS — J3081 Allergic rhinitis due to animal (cat) (dog) hair and dander: Secondary | ICD-10-CM | POA: Diagnosis not present

## 2021-11-09 DIAGNOSIS — Z0001 Encounter for general adult medical examination with abnormal findings: Secondary | ICD-10-CM | POA: Insufficient documentation

## 2021-11-09 DIAGNOSIS — J3089 Other allergic rhinitis: Secondary | ICD-10-CM | POA: Diagnosis not present

## 2021-11-09 DIAGNOSIS — Z122 Encounter for screening for malignant neoplasm of respiratory organs: Secondary | ICD-10-CM | POA: Diagnosis not present

## 2021-11-09 DIAGNOSIS — J301 Allergic rhinitis due to pollen: Secondary | ICD-10-CM | POA: Diagnosis not present

## 2021-11-11 ENCOUNTER — Telehealth: Payer: Self-pay

## 2021-11-11 NOTE — Telephone Encounter (Signed)
Spoke to pt, informed her of recommendations. Pt voiced understanding.  

## 2021-11-11 NOTE — Telephone Encounter (Signed)
Spoke to pt, she informed me that she is having no symptoms. She wanted to know what to do or what to take if she does have a flare up?

## 2021-11-11 NOTE — Telephone Encounter (Signed)
Phoned to give the pt results of her pathology report and she advises that she is had /having a flare up of her diverticulitis. Please call pt. She states she had mentioned it when she came in for her ov

## 2021-11-11 NOTE — Telephone Encounter (Signed)
She should call and let us know if she has a flare as we usually proceed with a CT scan to document true diverticulitis.

## 2021-11-11 NOTE — Telephone Encounter (Signed)
Courtney, please call patient to find out more about her symptoms.  What are her symptoms and when did they start?  Is she having abdominal pain?  What is the location of her pain?  Any change in bowel habits with diarrhea or constipation?  Rectal bleeding, nausea, vomiting, fever?

## 2021-11-16 DIAGNOSIS — J301 Allergic rhinitis due to pollen: Secondary | ICD-10-CM | POA: Diagnosis not present

## 2021-11-16 DIAGNOSIS — J3081 Allergic rhinitis due to animal (cat) (dog) hair and dander: Secondary | ICD-10-CM | POA: Diagnosis not present

## 2021-11-16 DIAGNOSIS — J3089 Other allergic rhinitis: Secondary | ICD-10-CM | POA: Diagnosis not present

## 2021-11-25 DIAGNOSIS — J301 Allergic rhinitis due to pollen: Secondary | ICD-10-CM | POA: Diagnosis not present

## 2021-11-25 DIAGNOSIS — J3089 Other allergic rhinitis: Secondary | ICD-10-CM | POA: Diagnosis not present

## 2021-11-25 DIAGNOSIS — J3081 Allergic rhinitis due to animal (cat) (dog) hair and dander: Secondary | ICD-10-CM | POA: Diagnosis not present

## 2021-11-30 DIAGNOSIS — J301 Allergic rhinitis due to pollen: Secondary | ICD-10-CM | POA: Diagnosis not present

## 2021-11-30 DIAGNOSIS — J3089 Other allergic rhinitis: Secondary | ICD-10-CM | POA: Diagnosis not present

## 2021-11-30 DIAGNOSIS — J3081 Allergic rhinitis due to animal (cat) (dog) hair and dander: Secondary | ICD-10-CM | POA: Diagnosis not present

## 2021-12-07 DIAGNOSIS — J301 Allergic rhinitis due to pollen: Secondary | ICD-10-CM | POA: Diagnosis not present

## 2021-12-07 DIAGNOSIS — J3081 Allergic rhinitis due to animal (cat) (dog) hair and dander: Secondary | ICD-10-CM | POA: Diagnosis not present

## 2021-12-07 DIAGNOSIS — J3089 Other allergic rhinitis: Secondary | ICD-10-CM | POA: Diagnosis not present

## 2021-12-14 DIAGNOSIS — J301 Allergic rhinitis due to pollen: Secondary | ICD-10-CM | POA: Diagnosis not present

## 2021-12-14 DIAGNOSIS — J3081 Allergic rhinitis due to animal (cat) (dog) hair and dander: Secondary | ICD-10-CM | POA: Diagnosis not present

## 2021-12-14 DIAGNOSIS — J3089 Other allergic rhinitis: Secondary | ICD-10-CM | POA: Diagnosis not present

## 2021-12-21 DIAGNOSIS — J301 Allergic rhinitis due to pollen: Secondary | ICD-10-CM | POA: Diagnosis not present

## 2021-12-21 DIAGNOSIS — R5383 Other fatigue: Secondary | ICD-10-CM | POA: Diagnosis not present

## 2021-12-21 DIAGNOSIS — J3081 Allergic rhinitis due to animal (cat) (dog) hair and dander: Secondary | ICD-10-CM | POA: Diagnosis not present

## 2021-12-21 DIAGNOSIS — J3089 Other allergic rhinitis: Secondary | ICD-10-CM | POA: Diagnosis not present

## 2021-12-21 DIAGNOSIS — N951 Menopausal and female climacteric states: Secondary | ICD-10-CM | POA: Diagnosis not present

## 2021-12-23 DIAGNOSIS — G479 Sleep disorder, unspecified: Secondary | ICD-10-CM | POA: Diagnosis not present

## 2021-12-23 DIAGNOSIS — R5383 Other fatigue: Secondary | ICD-10-CM | POA: Diagnosis not present

## 2021-12-23 DIAGNOSIS — N951 Menopausal and female climacteric states: Secondary | ICD-10-CM | POA: Diagnosis not present

## 2021-12-23 DIAGNOSIS — Z6823 Body mass index (BMI) 23.0-23.9, adult: Secondary | ICD-10-CM | POA: Diagnosis not present

## 2021-12-23 NOTE — Progress Notes (Deleted)
66 y.o. G9P0000 Widowed Caucasian female here for annual exam.    PCP:     Patient's last menstrual period was 02/06/2018 (exact date).           Sexually active: {yes no:314532}  The current method of family planning is tubal ligation.    Exercising: {yes no:314532}  {types:19826} Smoker:  no  Health Maintenance: Pap:  12-25-20 Neg:Neg HR HPV, 12-15-17 Neg:Neg HR HPV, 11-11-15 Neg:Neg HR HPV History of abnormal Pap:  Yes,  06-26-15 ASCUS:Neg HR HPV,  11-01-14 LGSIL, colpo HPV effect & benign endocervical glandular tissue. MMG:  09-15-21 diag.Bil./stable probably benign Rt.Br.calcifications/Neg/Birads3 Colonoscopy:  10-30-21 polyps;10 years BMD:  01-04-19  Result :Osteopenia TDaP:  12-18-18 Gardasil:   no HIV: 12-25-20 NR Hep C: 12-25-20 Neg Screening Labs:  Hb today: ***, Urine today: ***   reports that she has been smoking cigarettes. She has been smoking an average of .5 packs per day. She has never used smokeless tobacco. She reports current alcohol use of about 12.0 standard drinks of alcohol per week. She reports that she does not use drugs.  Past Medical History:  Diagnosis Date   Abdominal pain, epigastric    Abnormal Pap smear of cervix    11-01-14 LGSIL   Arthritis    in back   Asthma    as a child   Constipation    COVID-19 virus infection 08/2020   History of Salmonella infection    HSV infection    Irritable bowel syndrome    Melanoma of skin, site unspecified    PONV (postoperative nausea and vomiting)    Rotator cuff syndrome of left shoulder    Seasonal allergies    Shoulder pain     Past Surgical History:  Procedure Laterality Date   COLONOSCOPY WITH PROPOFOL N/A 10/30/2021   Procedure: COLONOSCOPY WITH PROPOFOL;  Surgeon: Eloise Harman, DO;  [facility];  Service: Endoscopy;  Laterality: N/A;  10:30am   COLPOSCOPY  2016   DILATION AND CURETTAGE OF UTERUS  1997   bleeding   MOLE REMOVAL     cancer   POLYPECTOMY  10/30/2021   Procedure: POLYPECTOMY;   Surgeon: Eloise Harman, DO;  [facility];  Service: Endoscopy;;   SKIN GRAFT     Thermal Ablation     of the uterus   TUBAL LIGATION      Current Outpatient Medications  Medication Sig Dispense Refill   albuterol (VENTOLIN HFA) 108 (90 Base) MCG/ACT inhaler Inhale 2 puffs into the lungs every 6 (six) hours as needed for wheezing or shortness of breath. 8 g 0   azithromycin (ZITHROMAX) 250 MG tablet Take 1 tablet (250 mg total) by mouth daily. Take first 2 tablets together, then 1 every day until finished. 6 tablet 0   Cyanocobalamin (VITAMIN B-12 SL) Place 3,000 mcg under the tongue in the morning.     EPINEPHrine 0.3 mg/0.3 mL IJ SOAJ injection Inject 0.3 mg into the muscle as needed for anaphylaxis.     fluticasone (FLONASE) 50 MCG/ACT nasal spray Place 1 spray into both nostrils daily as needed for allergies.     levocetirizine (XYZAL) 5 MG tablet Take 5 mg by mouth at bedtime.     montelukast (SINGULAIR) 10 MG tablet Take 10 mg by mouth in the morning.     polyethylene glycol-electrolytes (TRILYTE) 420 g solution Take 4,000 mLs by mouth as directed. 4000 mL 0   progesterone (PROMETRIUM) 100 MG capsule Take 100  mg by mouth every other day. At night.     promethazine-dextromethorphan (PROMETHAZINE-DM) 6.25-15 MG/5ML syrup Take 5 mLs by mouth 4 (four) times daily as needed for cough. 140 mL 0   UNABLE TO FIND Allergy shots     UNABLE TO FIND Estrogen/testosterone pellet     UNABLE TO FIND Med Name:Venom injections     valACYclovir (VALTREX) 500 MG tablet Take one tablet (500 mg) by mouth daily for prevention.  Take one tablet by mouth twice a day for 3 days for an outbreak. (Patient taking differently: Take 500 mg by mouth daily as needed (outbreaks). Take one tablet (500 mg) by mouth daily for prevention.  Take one tablet by mouth twice a day for 3 days for an outbreak.) 110 tablet 3   No current facility-administered medications for this visit.    Family History   Problem Relation Age of Onset   Hypertension Mother    Heart attack Mother    Fibroids Mother        h/x hysterectomy   Hypothyroidism Mother    Diabetes Mother    Acromegaly Mother    Alzheimer's disease Mother    Hypertension Father    Kidney Stones Father    Diabetes Father    Heart block Father        stint put in   Leukemia Brother    Diabetes Maternal Grandmother    Heart failure Maternal Grandmother    Depression Maternal Grandfather    Parkinson's disease Maternal Grandfather    Cancer Maternal Grandfather        brain   Breast cancer Paternal Aunt    Breast cancer Paternal Aunt    Colon cancer Neg Hx     Review of Systems  Exam:   LMP 02/06/2018 (Exact Date)     General appearance: alert, cooperative and appears stated age Head: normocephalic, without obvious abnormality, atraumatic Neck: no adenopathy, supple, symmetrical, trachea midline and thyroid normal to inspection and palpation Lungs: clear to auscultation bilaterally Breasts: normal appearance, no masses or tenderness, No nipple retraction or dimpling, No nipple discharge or bleeding, No axillary adenopathy Heart: regular rate and rhythm Abdomen: soft, non-tender; no masses, no organomegaly Extremities: extremities normal, atraumatic, no cyanosis or edema Skin: skin color, texture, turgor normal. No rashes or lesions Lymph nodes: cervical, supraclavicular, and axillary nodes normal. Neurologic: grossly normal  Pelvic: External genitalia:  no lesions              No abnormal inguinal nodes palpated.              Urethra:  normal appearing urethra with no masses, tenderness or lesions              Bartholins and Skenes: normal                 Vagina: normal appearing vagina with normal color and discharge, no lesions              Cervix: no lesions              Pap taken: {yes no:314532} Bimanual Exam:  Uterus:  normal size, contour, position, consistency, mobility, non-tender              Adnexa:  no mass, fullness, tenderness              Rectal exam: {yes no:314532}.  Confirms.              Anus:  normal sphincter  tone, no lesions  Chaperone was present for exam:  ***  Assessment:   Well woman visit with gynecologic exam.   Plan: Mammogram screening discussed. Self breast awareness reviewed. Pap and HR HPV as above. Guidelines for Calcium, Vitamin D, regular exercise program including cardiovascular and weight bearing exercise.   Follow up annually and prn.   Additional counseling given.  {yes [id number]. _______ minutes face to face time of which over 50% was spent in counseling.    After visit summary provided.

## 2021-12-28 ENCOUNTER — Ambulatory Visit: Payer: BC Managed Care – PPO | Admitting: Obstetrics and Gynecology

## 2021-12-30 DIAGNOSIS — J3081 Allergic rhinitis due to animal (cat) (dog) hair and dander: Secondary | ICD-10-CM | POA: Diagnosis not present

## 2021-12-30 DIAGNOSIS — J301 Allergic rhinitis due to pollen: Secondary | ICD-10-CM | POA: Diagnosis not present

## 2021-12-31 NOTE — Progress Notes (Signed)
66 y.o. G68P0000 Widowed Caucasian female here for annual exam.    Having left labia pubic bone pain, jabbing in nature, and occurring for a few months.  No dysuria.  Normal bowel movements.   Taking HRT through [facility].  Doing well.  Taking the Prometrium every other night.   Engaged to be married.  He was dx with prostate cancer.  Declines STD testing.   PCP:   Sharilyn Sites, MD  Patient's last menstrual period was 02/06/2018 (exact date).           Sexually active: Yes.    The current method of family planning is tubal ligation.    Exercising: No.  The patient does not participate in regular exercise at present. Smoker:  Yes, trying to quit  Health Maintenance: Pap:  12-25-20 Neg:Neg HR HPV, 12-15-17 Neg:Neg HR HPV, 11-11-15 Neg:Neg HR HPV History of abnormal Pap:  yes,  06-26-15 ASCUS:Neg HR HPV,  11-01-14 LGSIL, colpo HPV effect & benign endocervical glandular tissue. MMG:  09-15-21 rt.Br.Neg;Lt.Br.probably benign left breast calcifications./Neg/Birads3 Colonoscopy:  10-30-21 polyps removed;10 years BMD:  01-04-19  Result :Osteopenia TDaP:  12-18-18 Gardasil:   no HIV: 12-25-20 NR Hep C: 12-25-20 Neg Screening  Labs:   PCP.   reports that she has been smoking cigarettes. She has been smoking an average of .5 packs per day. She has never used smokeless tobacco. She reports current alcohol use of about 12.0 standard drinks of alcohol per week. She reports that she does not use drugs.  Past Medical History:  Diagnosis Date   Abdominal pain, epigastric    Abnormal Pap smear of cervix    11-01-14 LGSIL   Arthritis    in back   Asthma    as a child   Constipation    COVID-19 virus infection 08/2020   History of Salmonella infection    HSV infection    Irritable bowel syndrome    Melanoma of skin, site unspecified    PONV (postoperative nausea and vomiting)    Rotator cuff syndrome of left shoulder    Seasonal allergies    Shoulder pain     Past Surgical History:  Procedure  Laterality Date   COLONOSCOPY WITH PROPOFOL N/A 10/30/2021   Procedure: COLONOSCOPY WITH PROPOFOL;  Surgeon: Eloise Harman, DO;  [facility];  Service: Endoscopy;  Laterality: N/A;  10:30am   COLPOSCOPY  2016   DILATION AND CURETTAGE OF UTERUS  1997   bleeding   MOLE REMOVAL     cancer   POLYPECTOMY  10/30/2021   Procedure: POLYPECTOMY;  Surgeon: Eloise Harman, DO;  [facility];  Service: Endoscopy;;   SKIN GRAFT     Thermal Ablation     of the uterus   TUBAL LIGATION      Current Outpatient Medications  Medication Sig Dispense Refill   albuterol (VENTOLIN HFA) 108 (90 Base) MCG/ACT inhaler Inhale 2 puffs into the lungs every 6 (six) hours as needed for wheezing or shortness of breath. 8 g 0   Cyanocobalamin (VITAMIN B-12 SL) Place 3,000 mcg under the tongue in the morning.     EPINEPHrine 0.3 mg/0.3 mL IJ SOAJ injection Inject 0.3 mg into the muscle as needed for anaphylaxis.     fluticasone (FLONASE) 50 MCG/ACT nasal spray Place 1 spray into both nostrils daily as needed for allergies.     levocetirizine (XYZAL) 5 MG tablet Take 5 mg by mouth at bedtime.     montelukast (  SINGULAIR) 10 MG tablet Take 10 mg by mouth in the morning.     progesterone (PROMETRIUM) 100 MG capsule Take 100 mg by mouth every other day. At night.     UNABLE TO FIND Allergy shots     UNABLE TO FIND Estrogen/testosterone pellet     UNABLE TO FIND Med Name:Venom injections     valACYclovir (VALTREX) 500 MG tablet Take one tablet (500 mg) by mouth daily for prevention.  Take one tablet by mouth twice a day for 3 days for an outbreak. (Patient taking differently: Take 500 mg by mouth daily as needed (outbreaks). Take one tablet (500 mg) by mouth daily for prevention.  Take one tablet by mouth twice a day for 3 days for an outbreak.) 110 tablet 3   No current facility-administered medications for this visit.    Family History  Problem Relation Age of Onset   Hypertension Mother     Heart attack Mother    Fibroids Mother        h/x hysterectomy   Hypothyroidism Mother    Diabetes Mother    Acromegaly Mother    Alzheimer's disease Mother    Hypertension Father    Kidney Stones Father    Diabetes Father    Heart block Father        stint put in   Leukemia Brother    Diabetes Maternal Grandmother    Heart failure Maternal Grandmother    Depression Maternal Grandfather    Parkinson's disease Maternal Grandfather    Cancer Maternal Grandfather        brain   Breast cancer Paternal Aunt    Breast cancer Paternal Aunt    Colon cancer Neg Hx     Review of Systems  Genitourinary:  Positive for pelvic pain (occ. LLQ pain;radiates to bladder/vulvar region).  All other systems reviewed and are negative.   Exam:   BP 130/64   Pulse 66   Ht '5\' 5"'$  (1.651 m)   Wt 150 lb (68 kg)   LMP 02/06/2018 (Exact Date)   SpO2 97%   BMI 24.96 kg/m     General appearance: alert, cooperative and appears stated age Head: normocephalic, without obvious abnormality, atraumatic Neck: no adenopathy, supple, symmetrical, trachea midline and thyroid normal to inspection and palpation Lungs: clear to auscultation bilaterally Breasts: normal appearance, no masses or tenderness, No nipple retraction or dimpling, No nipple discharge or bleeding, No axillary adenopathy Heart: regular rate and rhythm Abdomen: soft, non-tender; no masses, no organomegaly Extremities: extremities normal, atraumatic, no cyanosis or edema Skin: skin color, texture, turgor normal. No rashes or lesions Lymph nodes: cervical, supraclavicular, and axillary nodes normal. Neurologic: grossly normal  Pelvic: External genitalia:  no lesions              No abnormal inguinal nodes palpated.              Urethra:  normal appearing urethra with no masses, tenderness or lesions              Bartholins and Skenes: normal                 Vagina: normal appearing vagina with normal color and discharge, no lesions               Cervix: no lesions              Pap taken: no Bimanual Exam:  Uterus:  normal size, contour, position, consistency, mobility, non-tender  Adnexa: no mass, fullness, tenderness              Rectal exam: yes.  Confirms.              Anus:  normal sphincter tone, no lesions  Chaperone was present for exam:  Estill Bamberg, CMA  Assessment:   Well woman visit with gynecologic exam. Hx postmenopausal bleeding.   Benign biopsy. Hx endometrial ablation.  HRT and testosterone tx at [facility].  Hx HSV. Left pubic bone pain, periodic.  This may be HSV related.   Hx LGSIL. Hx melanoma.  Osteopenia.   Plan: Mammogram dx left and left breast US due.  Self breast awareness reviewed. Pap and HR HPV 2025.  Guidelines for Calcium, Vitamin D, regular exercise program including cardiovascular and weight bearing exercise. Take Prometrium nightly to prevent uterine cancer.  Discused WHI and use of HRT which can increase risk of PE, DVT, MI, stroke and breast cancer.  Refill of Valtrex.  BMD ordered for [facility].  Follow up annually and prn.   After visit summary provided.

## 2022-01-04 ENCOUNTER — Ambulatory Visit (INDEPENDENT_AMBULATORY_CARE_PROVIDER_SITE_OTHER): Payer: BC Managed Care – PPO | Admitting: Obstetrics and Gynecology

## 2022-01-04 ENCOUNTER — Telehealth: Payer: Self-pay | Admitting: Obstetrics and Gynecology

## 2022-01-04 ENCOUNTER — Encounter: Payer: Self-pay | Admitting: Obstetrics and Gynecology

## 2022-01-04 VITALS — BP 130/64 | HR 66 | Ht 65.0 in | Wt 150.0 lb

## 2022-01-04 DIAGNOSIS — Z09 Encounter for follow-up examination after completed treatment for conditions other than malignant neoplasm: Secondary | ICD-10-CM

## 2022-01-04 DIAGNOSIS — Z01419 Encounter for gynecological examination (general) (routine) without abnormal findings: Secondary | ICD-10-CM | POA: Diagnosis not present

## 2022-01-04 DIAGNOSIS — M8580 Other specified disorders of bone density and structure, unspecified site: Secondary | ICD-10-CM | POA: Diagnosis not present

## 2022-01-04 MED ORDER — VALACYCLOVIR HCL 500 MG PO TABS
ORAL_TABLET | ORAL | 3 refills | Status: DC
Start: 2022-01-04 — End: 2023-06-23

## 2022-01-04 NOTE — Patient Instructions (Signed)

## 2022-01-04 NOTE — Telephone Encounter (Signed)
Orders placed at [facility] on 01/20/22 @ 9:20am . Patient informed.

## 2022-01-04 NOTE — Telephone Encounter (Signed)
Please note the mammogram and breast US is due at [facility].

## 2022-01-04 NOTE — Telephone Encounter (Signed)
Please assist in schedule a dx left mammogram and left breast ultrasound follow up at the [place].   Patient is due this month.

## 2022-01-08 DIAGNOSIS — J301 Allergic rhinitis due to pollen: Secondary | ICD-10-CM | POA: Diagnosis not present

## 2022-01-08 DIAGNOSIS — J3089 Other allergic rhinitis: Secondary | ICD-10-CM | POA: Diagnosis not present

## 2022-01-08 DIAGNOSIS — J3081 Allergic rhinitis due to animal (cat) (dog) hair and dander: Secondary | ICD-10-CM | POA: Diagnosis not present

## 2022-01-11 DIAGNOSIS — J301 Allergic rhinitis due to pollen: Secondary | ICD-10-CM | POA: Diagnosis not present

## 2022-01-11 DIAGNOSIS — J3089 Other allergic rhinitis: Secondary | ICD-10-CM | POA: Diagnosis not present

## 2022-01-11 DIAGNOSIS — J3081 Allergic rhinitis due to animal (cat) (dog) hair and dander: Secondary | ICD-10-CM | POA: Diagnosis not present

## 2022-01-18 ENCOUNTER — Other Ambulatory Visit: Payer: BC Managed Care – PPO

## 2022-01-18 DIAGNOSIS — J3089 Other allergic rhinitis: Secondary | ICD-10-CM | POA: Diagnosis not present

## 2022-01-18 DIAGNOSIS — J301 Allergic rhinitis due to pollen: Secondary | ICD-10-CM | POA: Diagnosis not present

## 2022-01-18 DIAGNOSIS — J3081 Allergic rhinitis due to animal (cat) (dog) hair and dander: Secondary | ICD-10-CM | POA: Diagnosis not present

## 2022-01-19 DIAGNOSIS — T63451D Toxic effect of venom of hornets, accidental (unintentional), subsequent encounter: Secondary | ICD-10-CM | POA: Diagnosis not present

## 2022-01-19 DIAGNOSIS — T63461D Toxic effect of venom of wasps, accidental (unintentional), subsequent encounter: Secondary | ICD-10-CM | POA: Diagnosis not present

## 2022-01-19 DIAGNOSIS — T63441D Toxic effect of venom of bees, accidental (unintentional), subsequent encounter: Secondary | ICD-10-CM | POA: Diagnosis not present

## 2022-01-20 ENCOUNTER — Ambulatory Visit (HOSPITAL_COMMUNITY)
Admission: RE | Admit: 2022-01-20 | Discharge: 2022-01-20 | Disposition: A | Discharge status: Home / Self Care | Payer: BC Managed Care – PPO | Source: Ambulatory Visit | Attending: Obstetrics and Gynecology | Admitting: Obstetrics and Gynecology

## 2022-01-20 DIAGNOSIS — Z09 Encounter for follow-up examination after completed treatment for conditions other than malignant neoplasm: Secondary | ICD-10-CM | POA: Diagnosis not present

## 2022-01-20 DIAGNOSIS — R921 Mammographic calcification found on diagnostic imaging of breast: Secondary | ICD-10-CM | POA: Diagnosis not present

## 2022-01-26 DIAGNOSIS — T63421D Toxic effect of venom of ants, accidental (unintentional), subsequent encounter: Secondary | ICD-10-CM | POA: Diagnosis not present

## 2022-01-26 DIAGNOSIS — T63451D Toxic effect of venom of hornets, accidental (unintentional), subsequent encounter: Secondary | ICD-10-CM | POA: Diagnosis not present

## 2022-01-26 DIAGNOSIS — T63441D Toxic effect of venom of bees, accidental (unintentional), subsequent encounter: Secondary | ICD-10-CM | POA: Diagnosis not present

## 2022-01-27 ENCOUNTER — Ambulatory Visit (HOSPITAL_COMMUNITY)
Admission: RE | Admit: 2022-01-27 | Discharge: 2022-01-27 | Disposition: A | Discharge status: Home / Self Care | Payer: BC Managed Care – PPO | Source: Ambulatory Visit | Attending: Obstetrics and Gynecology | Admitting: Obstetrics and Gynecology

## 2022-01-27 DIAGNOSIS — M8580 Other specified disorders of bone density and structure, unspecified site: Secondary | ICD-10-CM | POA: Insufficient documentation

## 2022-01-27 DIAGNOSIS — M8589 Other specified disorders of bone density and structure, multiple sites: Secondary | ICD-10-CM | POA: Diagnosis not present

## 2022-01-27 DIAGNOSIS — Z780 Asymptomatic menopausal state: Secondary | ICD-10-CM | POA: Diagnosis not present

## 2022-02-01 DIAGNOSIS — T63461D Toxic effect of venom of wasps, accidental (unintentional), subsequent encounter: Secondary | ICD-10-CM | POA: Diagnosis not present

## 2022-02-01 DIAGNOSIS — T63451D Toxic effect of venom of hornets, accidental (unintentional), subsequent encounter: Secondary | ICD-10-CM | POA: Diagnosis not present

## 2022-02-01 DIAGNOSIS — T63441D Toxic effect of venom of bees, accidental (unintentional), subsequent encounter: Secondary | ICD-10-CM | POA: Diagnosis not present

## 2022-02-02 DIAGNOSIS — J3081 Allergic rhinitis due to animal (cat) (dog) hair and dander: Secondary | ICD-10-CM | POA: Diagnosis not present

## 2022-02-02 DIAGNOSIS — J3089 Other allergic rhinitis: Secondary | ICD-10-CM | POA: Diagnosis not present

## 2022-02-02 DIAGNOSIS — J301 Allergic rhinitis due to pollen: Secondary | ICD-10-CM | POA: Diagnosis not present

## 2022-02-15 DIAGNOSIS — T63441D Toxic effect of venom of bees, accidental (unintentional), subsequent encounter: Secondary | ICD-10-CM | POA: Diagnosis not present

## 2022-02-15 DIAGNOSIS — T63421D Toxic effect of venom of ants, accidental (unintentional), subsequent encounter: Secondary | ICD-10-CM | POA: Diagnosis not present

## 2022-02-15 DIAGNOSIS — T63451D Toxic effect of venom of hornets, accidental (unintentional), subsequent encounter: Secondary | ICD-10-CM | POA: Diagnosis not present

## 2022-02-16 DIAGNOSIS — Z6824 Body mass index (BMI) 24.0-24.9, adult: Secondary | ICD-10-CM | POA: Diagnosis not present

## 2022-02-16 DIAGNOSIS — J0100 Acute maxillary sinusitis, unspecified: Secondary | ICD-10-CM | POA: Diagnosis not present

## 2022-02-22 DIAGNOSIS — T63441D Toxic effect of venom of bees, accidental (unintentional), subsequent encounter: Secondary | ICD-10-CM | POA: Diagnosis not present

## 2022-02-22 DIAGNOSIS — T63461D Toxic effect of venom of wasps, accidental (unintentional), subsequent encounter: Secondary | ICD-10-CM | POA: Diagnosis not present

## 2022-02-22 DIAGNOSIS — T63451D Toxic effect of venom of hornets, accidental (unintentional), subsequent encounter: Secondary | ICD-10-CM | POA: Diagnosis not present

## 2022-02-24 DIAGNOSIS — J3089 Other allergic rhinitis: Secondary | ICD-10-CM | POA: Diagnosis not present

## 2022-02-24 DIAGNOSIS — T781XXA Other adverse food reactions, not elsewhere classified, initial encounter: Secondary | ICD-10-CM | POA: Diagnosis not present

## 2022-02-24 DIAGNOSIS — J301 Allergic rhinitis due to pollen: Secondary | ICD-10-CM | POA: Diagnosis not present

## 2022-02-24 DIAGNOSIS — J3081 Allergic rhinitis due to animal (cat) (dog) hair and dander: Secondary | ICD-10-CM | POA: Diagnosis not present

## 2022-03-01 DIAGNOSIS — T63461D Toxic effect of venom of wasps, accidental (unintentional), subsequent encounter: Secondary | ICD-10-CM | POA: Diagnosis not present

## 2022-03-01 DIAGNOSIS — T63451D Toxic effect of venom of hornets, accidental (unintentional), subsequent encounter: Secondary | ICD-10-CM | POA: Diagnosis not present

## 2022-03-01 DIAGNOSIS — T63441D Toxic effect of venom of bees, accidental (unintentional), subsequent encounter: Secondary | ICD-10-CM | POA: Diagnosis not present

## 2022-03-08 DIAGNOSIS — J3081 Allergic rhinitis due to animal (cat) (dog) hair and dander: Secondary | ICD-10-CM | POA: Diagnosis not present

## 2022-03-08 DIAGNOSIS — J3089 Other allergic rhinitis: Secondary | ICD-10-CM | POA: Diagnosis not present

## 2022-03-08 DIAGNOSIS — J301 Allergic rhinitis due to pollen: Secondary | ICD-10-CM | POA: Diagnosis not present

## 2022-03-15 DIAGNOSIS — J301 Allergic rhinitis due to pollen: Secondary | ICD-10-CM | POA: Diagnosis not present

## 2022-03-15 DIAGNOSIS — J3089 Other allergic rhinitis: Secondary | ICD-10-CM | POA: Diagnosis not present

## 2022-03-15 DIAGNOSIS — J3081 Allergic rhinitis due to animal (cat) (dog) hair and dander: Secondary | ICD-10-CM | POA: Diagnosis not present

## 2022-03-22 DIAGNOSIS — J301 Allergic rhinitis due to pollen: Secondary | ICD-10-CM | POA: Diagnosis not present

## 2022-03-22 DIAGNOSIS — J3089 Other allergic rhinitis: Secondary | ICD-10-CM | POA: Diagnosis not present

## 2022-03-22 DIAGNOSIS — J3081 Allergic rhinitis due to animal (cat) (dog) hair and dander: Secondary | ICD-10-CM | POA: Diagnosis not present

## 2022-03-29 DIAGNOSIS — R5383 Other fatigue: Secondary | ICD-10-CM | POA: Diagnosis not present

## 2022-03-29 DIAGNOSIS — N951 Menopausal and female climacteric states: Secondary | ICD-10-CM | POA: Diagnosis not present

## 2022-04-05 DIAGNOSIS — Z79890 Hormone replacement therapy (postmenopausal): Secondary | ICD-10-CM | POA: Diagnosis not present

## 2022-04-05 DIAGNOSIS — J301 Allergic rhinitis due to pollen: Secondary | ICD-10-CM | POA: Diagnosis not present

## 2022-04-05 DIAGNOSIS — N951 Menopausal and female climacteric states: Secondary | ICD-10-CM | POA: Diagnosis not present

## 2022-04-05 DIAGNOSIS — N898 Other specified noninflammatory disorders of vagina: Secondary | ICD-10-CM | POA: Diagnosis not present

## 2022-04-05 DIAGNOSIS — J3089 Other allergic rhinitis: Secondary | ICD-10-CM | POA: Diagnosis not present

## 2022-04-05 DIAGNOSIS — J3081 Allergic rhinitis due to animal (cat) (dog) hair and dander: Secondary | ICD-10-CM | POA: Diagnosis not present

## 2022-04-05 DIAGNOSIS — G479 Sleep disorder, unspecified: Secondary | ICD-10-CM | POA: Diagnosis not present

## 2022-04-12 DIAGNOSIS — J3089 Other allergic rhinitis: Secondary | ICD-10-CM | POA: Diagnosis not present

## 2022-04-12 DIAGNOSIS — J301 Allergic rhinitis due to pollen: Secondary | ICD-10-CM | POA: Diagnosis not present

## 2022-04-12 DIAGNOSIS — J3081 Allergic rhinitis due to animal (cat) (dog) hair and dander: Secondary | ICD-10-CM | POA: Diagnosis not present

## 2022-04-14 DIAGNOSIS — L298 Other pruritus: Secondary | ICD-10-CM | POA: Diagnosis not present

## 2022-04-14 DIAGNOSIS — D225 Melanocytic nevi of trunk: Secondary | ICD-10-CM | POA: Diagnosis not present

## 2022-04-14 DIAGNOSIS — L570 Actinic keratosis: Secondary | ICD-10-CM | POA: Diagnosis not present

## 2022-04-14 DIAGNOSIS — L821 Other seborrheic keratosis: Secondary | ICD-10-CM | POA: Diagnosis not present

## 2022-04-14 DIAGNOSIS — L814 Other melanin hyperpigmentation: Secondary | ICD-10-CM | POA: Diagnosis not present

## 2022-04-14 DIAGNOSIS — L538 Other specified erythematous conditions: Secondary | ICD-10-CM | POA: Diagnosis not present

## 2022-04-14 DIAGNOSIS — L820 Inflamed seborrheic keratosis: Secondary | ICD-10-CM | POA: Diagnosis not present

## 2022-04-19 DIAGNOSIS — J3081 Allergic rhinitis due to animal (cat) (dog) hair and dander: Secondary | ICD-10-CM | POA: Diagnosis not present

## 2022-04-19 DIAGNOSIS — J3089 Other allergic rhinitis: Secondary | ICD-10-CM | POA: Diagnosis not present

## 2022-04-19 DIAGNOSIS — J301 Allergic rhinitis due to pollen: Secondary | ICD-10-CM | POA: Diagnosis not present

## 2022-04-26 DIAGNOSIS — T63451D Toxic effect of venom of hornets, accidental (unintentional), subsequent encounter: Secondary | ICD-10-CM | POA: Diagnosis not present

## 2022-04-26 DIAGNOSIS — T63461D Toxic effect of venom of wasps, accidental (unintentional), subsequent encounter: Secondary | ICD-10-CM | POA: Diagnosis not present

## 2022-04-26 DIAGNOSIS — T63441D Toxic effect of venom of bees, accidental (unintentional), subsequent encounter: Secondary | ICD-10-CM | POA: Diagnosis not present

## 2022-05-03 DIAGNOSIS — T63461D Toxic effect of venom of wasps, accidental (unintentional), subsequent encounter: Secondary | ICD-10-CM | POA: Diagnosis not present

## 2022-05-03 DIAGNOSIS — T63451D Toxic effect of venom of hornets, accidental (unintentional), subsequent encounter: Secondary | ICD-10-CM | POA: Diagnosis not present

## 2022-05-03 DIAGNOSIS — T63441D Toxic effect of venom of bees, accidental (unintentional), subsequent encounter: Secondary | ICD-10-CM | POA: Diagnosis not present

## 2022-05-10 DIAGNOSIS — T63441D Toxic effect of venom of bees, accidental (unintentional), subsequent encounter: Secondary | ICD-10-CM | POA: Diagnosis not present

## 2022-05-10 DIAGNOSIS — T63461D Toxic effect of venom of wasps, accidental (unintentional), subsequent encounter: Secondary | ICD-10-CM | POA: Diagnosis not present

## 2022-05-10 DIAGNOSIS — T63451D Toxic effect of venom of hornets, accidental (unintentional), subsequent encounter: Secondary | ICD-10-CM | POA: Diagnosis not present

## 2022-05-14 DIAGNOSIS — J3081 Allergic rhinitis due to animal (cat) (dog) hair and dander: Secondary | ICD-10-CM | POA: Diagnosis not present

## 2022-05-14 DIAGNOSIS — J301 Allergic rhinitis due to pollen: Secondary | ICD-10-CM | POA: Diagnosis not present

## 2022-05-14 DIAGNOSIS — J3089 Other allergic rhinitis: Secondary | ICD-10-CM | POA: Diagnosis not present

## 2022-05-26 DIAGNOSIS — J22 Unspecified acute lower respiratory infection: Secondary | ICD-10-CM | POA: Diagnosis not present

## 2022-05-26 DIAGNOSIS — Z6824 Body mass index (BMI) 24.0-24.9, adult: Secondary | ICD-10-CM | POA: Diagnosis not present

## 2022-06-03 DIAGNOSIS — T63451D Toxic effect of venom of hornets, accidental (unintentional), subsequent encounter: Secondary | ICD-10-CM | POA: Diagnosis not present

## 2022-06-03 DIAGNOSIS — T63441D Toxic effect of venom of bees, accidental (unintentional), subsequent encounter: Secondary | ICD-10-CM | POA: Diagnosis not present

## 2022-06-03 DIAGNOSIS — T63461D Toxic effect of venom of wasps, accidental (unintentional), subsequent encounter: Secondary | ICD-10-CM | POA: Diagnosis not present

## 2022-06-18 DIAGNOSIS — J209 Acute bronchitis, unspecified: Secondary | ICD-10-CM | POA: Diagnosis not present

## 2022-06-18 DIAGNOSIS — Z6824 Body mass index (BMI) 24.0-24.9, adult: Secondary | ICD-10-CM | POA: Diagnosis not present

## 2022-06-18 DIAGNOSIS — I700 Atherosclerosis of aorta: Secondary | ICD-10-CM | POA: Diagnosis not present

## 2022-06-18 DIAGNOSIS — U071 COVID-19: Secondary | ICD-10-CM | POA: Diagnosis not present

## 2022-06-18 DIAGNOSIS — J0100 Acute maxillary sinusitis, unspecified: Secondary | ICD-10-CM | POA: Diagnosis not present

## 2022-07-01 DIAGNOSIS — J324 Chronic pansinusitis: Secondary | ICD-10-CM | POA: Diagnosis not present

## 2022-07-01 DIAGNOSIS — N951 Menopausal and female climacteric states: Secondary | ICD-10-CM | POA: Diagnosis not present

## 2022-07-01 DIAGNOSIS — J343 Hypertrophy of nasal turbinates: Secondary | ICD-10-CM | POA: Diagnosis not present

## 2022-07-01 DIAGNOSIS — R5383 Other fatigue: Secondary | ICD-10-CM | POA: Diagnosis not present

## 2022-07-05 DIAGNOSIS — Z79890 Hormone replacement therapy (postmenopausal): Secondary | ICD-10-CM | POA: Diagnosis not present

## 2022-07-05 DIAGNOSIS — M2550 Pain in unspecified joint: Secondary | ICD-10-CM | POA: Diagnosis not present

## 2022-07-05 DIAGNOSIS — R232 Flushing: Secondary | ICD-10-CM | POA: Diagnosis not present

## 2022-07-05 DIAGNOSIS — N951 Menopausal and female climacteric states: Secondary | ICD-10-CM | POA: Diagnosis not present

## 2022-07-08 ENCOUNTER — Other Ambulatory Visit: Payer: Self-pay | Admitting: Otolaryngology

## 2022-07-08 DIAGNOSIS — J329 Chronic sinusitis, unspecified: Secondary | ICD-10-CM

## 2022-07-16 DIAGNOSIS — T63461D Toxic effect of venom of wasps, accidental (unintentional), subsequent encounter: Secondary | ICD-10-CM | POA: Diagnosis not present

## 2022-07-16 DIAGNOSIS — T63441D Toxic effect of venom of bees, accidental (unintentional), subsequent encounter: Secondary | ICD-10-CM | POA: Diagnosis not present

## 2022-07-16 DIAGNOSIS — T63451D Toxic effect of venom of hornets, accidental (unintentional), subsequent encounter: Secondary | ICD-10-CM | POA: Diagnosis not present

## 2022-07-19 ENCOUNTER — Ambulatory Visit
Admission: RE | Admit: 2022-07-19 | Discharge: 2022-07-19 | Disposition: A | Discharge status: Home / Self Care | Payer: BC Managed Care – PPO | Source: Ambulatory Visit | Attending: Otolaryngology | Admitting: Otolaryngology

## 2022-07-19 DIAGNOSIS — J329 Chronic sinusitis, unspecified: Secondary | ICD-10-CM

## 2022-07-23 DIAGNOSIS — T63451D Toxic effect of venom of hornets, accidental (unintentional), subsequent encounter: Secondary | ICD-10-CM | POA: Diagnosis not present

## 2022-07-23 DIAGNOSIS — J322 Chronic ethmoidal sinusitis: Secondary | ICD-10-CM | POA: Diagnosis not present

## 2022-07-23 DIAGNOSIS — J321 Chronic frontal sinusitis: Secondary | ICD-10-CM | POA: Diagnosis not present

## 2022-07-23 DIAGNOSIS — J343 Hypertrophy of nasal turbinates: Secondary | ICD-10-CM | POA: Diagnosis not present

## 2022-07-23 DIAGNOSIS — T63441D Toxic effect of venom of bees, accidental (unintentional), subsequent encounter: Secondary | ICD-10-CM | POA: Diagnosis not present

## 2022-07-23 DIAGNOSIS — T63461D Toxic effect of venom of wasps, accidental (unintentional), subsequent encounter: Secondary | ICD-10-CM | POA: Diagnosis not present

## 2022-07-23 DIAGNOSIS — J320 Chronic maxillary sinusitis: Secondary | ICD-10-CM | POA: Diagnosis not present

## 2022-08-04 ENCOUNTER — Other Ambulatory Visit: Payer: BC Managed Care – PPO

## 2022-08-17 ENCOUNTER — Other Ambulatory Visit (INDEPENDENT_AMBULATORY_CARE_PROVIDER_SITE_OTHER): Payer: Self-pay | Admitting: Otolaryngology

## 2022-08-17 DIAGNOSIS — J3489 Other specified disorders of nose and nasal sinuses: Secondary | ICD-10-CM | POA: Diagnosis not present

## 2022-08-17 DIAGNOSIS — J320 Chronic maxillary sinusitis: Secondary | ICD-10-CM | POA: Diagnosis not present

## 2022-08-17 DIAGNOSIS — J338 Other polyp of sinus: Secondary | ICD-10-CM | POA: Diagnosis not present

## 2022-08-17 DIAGNOSIS — J343 Hypertrophy of nasal turbinates: Secondary | ICD-10-CM | POA: Diagnosis not present

## 2022-08-17 DIAGNOSIS — J321 Chronic frontal sinusitis: Secondary | ICD-10-CM | POA: Diagnosis not present

## 2022-08-17 DIAGNOSIS — J322 Chronic ethmoidal sinusitis: Secondary | ICD-10-CM | POA: Diagnosis not present

## 2022-08-19 DIAGNOSIS — J322 Chronic ethmoidal sinusitis: Secondary | ICD-10-CM | POA: Diagnosis not present

## 2022-08-19 DIAGNOSIS — J320 Chronic maxillary sinusitis: Secondary | ICD-10-CM | POA: Diagnosis not present

## 2022-08-19 DIAGNOSIS — J321 Chronic frontal sinusitis: Secondary | ICD-10-CM | POA: Diagnosis not present

## 2022-08-19 DIAGNOSIS — J338 Other polyp of sinus: Secondary | ICD-10-CM | POA: Diagnosis not present

## 2022-08-27 DIAGNOSIS — J0101 Acute recurrent maxillary sinusitis: Secondary | ICD-10-CM | POA: Diagnosis not present

## 2022-08-27 DIAGNOSIS — Z6824 Body mass index (BMI) 24.0-24.9, adult: Secondary | ICD-10-CM | POA: Diagnosis not present

## 2022-08-27 DIAGNOSIS — Z20828 Contact with and (suspected) exposure to other viral communicable diseases: Secondary | ICD-10-CM | POA: Diagnosis not present

## 2022-08-27 DIAGNOSIS — R6889 Other general symptoms and signs: Secondary | ICD-10-CM | POA: Diagnosis not present

## 2022-09-07 DIAGNOSIS — J338 Other polyp of sinus: Secondary | ICD-10-CM | POA: Diagnosis not present

## 2022-09-07 DIAGNOSIS — J322 Chronic ethmoidal sinusitis: Secondary | ICD-10-CM | POA: Diagnosis not present

## 2022-09-07 DIAGNOSIS — J321 Chronic frontal sinusitis: Secondary | ICD-10-CM | POA: Diagnosis not present

## 2022-09-07 DIAGNOSIS — J320 Chronic maxillary sinusitis: Secondary | ICD-10-CM | POA: Diagnosis not present

## 2022-09-28 DIAGNOSIS — J322 Chronic ethmoidal sinusitis: Secondary | ICD-10-CM | POA: Diagnosis not present

## 2022-09-28 DIAGNOSIS — J338 Other polyp of sinus: Secondary | ICD-10-CM | POA: Diagnosis not present

## 2022-09-28 DIAGNOSIS — J321 Chronic frontal sinusitis: Secondary | ICD-10-CM | POA: Diagnosis not present

## 2022-09-28 DIAGNOSIS — J320 Chronic maxillary sinusitis: Secondary | ICD-10-CM | POA: Diagnosis not present

## 2022-10-03 ENCOUNTER — Emergency Department (HOSPITAL_BASED_OUTPATIENT_CLINIC_OR_DEPARTMENT_OTHER)
Admission: EM | Admit: 2022-10-03 | Discharge: 2022-10-03 | Disposition: A | Discharge status: Home / Self Care | Payer: Medicare HMO | Attending: Emergency Medicine | Admitting: Emergency Medicine

## 2022-10-03 ENCOUNTER — Emergency Department (HOSPITAL_BASED_OUTPATIENT_CLINIC_OR_DEPARTMENT_OTHER): Payer: Medicare HMO

## 2022-10-03 ENCOUNTER — Encounter (HOSPITAL_BASED_OUTPATIENT_CLINIC_OR_DEPARTMENT_OTHER): Payer: Self-pay

## 2022-10-03 DIAGNOSIS — R42 Dizziness and giddiness: Secondary | ICD-10-CM

## 2022-10-03 LAB — CBC
HCT: 40.5 % (ref 36.0–46.0)
Hemoglobin: 13.6 g/dL (ref 12.0–15.0)
MCH: 31.3 pg (ref 26.0–34.0)
MCHC: 33.6 g/dL (ref 30.0–36.0)
MCV: 93.3 fL (ref 80.0–100.0)
Platelets: 146 10*3/uL — ABNORMAL LOW (ref 150–400)
RBC: 4.34 MIL/uL (ref 3.87–5.11)
RDW: 12.3 % (ref 11.5–15.5)
WBC: 6.7 10*3/uL (ref 4.0–10.5)
nRBC: 0 % (ref 0.0–0.2)

## 2022-10-03 LAB — BASIC METABOLIC PANEL
Anion gap: 8 (ref 5–15)
BUN: 10 mg/dL (ref 8–23)
CO2: 25 mmol/L (ref 22–32)
Calcium: 9.1 mg/dL (ref 8.9–10.3)
Chloride: 103 mmol/L (ref 98–111)
Creatinine, Ser: 0.61 mg/dL (ref 0.44–1.00)
GFR, Estimated: >60 mL/min (ref >60)
Glucose, Bld: 83 mg/dL (ref 70–99)
Potassium: 4.1 mmol/L (ref 3.5–5.1)
Sodium: 136 mmol/L (ref 135–145)

## 2022-10-03 MED ORDER — MECLIZINE HCL 25 MG PO TABS: 25.0000 mg | ORAL_TABLET | Freq: Three times a day (TID) | ORAL | 0 refills | Status: DC | PRN

## 2022-10-03 MED ORDER — MECLIZINE HCL 25 MG PO TABS
25.0000 mg | ORAL_TABLET | Freq: Once | ORAL | Status: AC
  Administered 2022-10-03: 25 mg via ORAL
  Filled 2022-10-03: qty 1

## 2022-10-03 NOTE — ED Triage Notes (Signed)
States got up this am with dizziness.  States had vomiting.  Dizziness worse with movement of head.  States no dizziness while lying still.  Pain to back of head.  Denies weakness.  Ambulatory to room with steadying assist

## 2022-10-03 NOTE — Discharge Instructions (Addendum)
Take the medications to help with dizziness.  Follow up with your doctor this week to be rechecked.  Consider doing the epley exercises listed in your discharge instructions to see if it helps resolve your symptoms.  Return to [facility] if you have worsening symptoms or trouble with weakness, vision or your speech.

## 2022-10-03 NOTE — ED Notes (Signed)
Patient transported to CT 

## 2022-10-03 NOTE — ED Provider Notes (Signed)
[place] EMERGENCY DEPARTMENT AT [facility] Provider Note   CSN: [id number] Arrival date & time: 10/03/22  1427     History {Add pertinent medical, surgical, social history, OB history to HPI:1} Chief Complaint  Patient presents with   Dizziness    [name] is a 67 y.o. female.   Dizziness    Pt woke up with complaints of dizziness.  Sx started this am.  Pt denies any numbness or weakness.  No trouble with visio nor speech.  She is feeling off balance and was banging in to walls.  Pt does feel the room is spinning.  SHe is also feeling nauseated.  It gets worse when she moves her head around. Pt called her ENT dr who told her to come to the ED to get a ct scan  Home Medications Prior to Admission medications   Medication Sig Start Date End Date Taking? Authorizing Provider  albuterol (VENTOLIN HFA) 108 (90 Base) MCG/ACT inhaler Inhale 2 puffs into the lungs every 6 (six) hours as needed for wheezing or shortness of breath. 10/21/21   Leath-Warren, Sadie Haber, NP  Cyanocobalamin (VITAMIN B-12 SL) Place 3,000 mcg under the tongue in the morning.    [provider]  EPINEPHrine 0.3 mg/0.3 mL IJ SOAJ injection Inject 0.3 mg into the muscle as needed for anaphylaxis. 11/28/19   [provider]  fluticasone (FLONASE) 50 MCG/ACT nasal spray Place 1 spray into both nostrils daily as needed for allergies. 02/23/18   [provider]  levocetirizine (XYZAL) 5 MG tablet Take 5 mg by mouth at bedtime. 02/23/18   [provider]  montelukast (SINGULAIR) 10 MG tablet Take 10 mg by mouth in the morning. 10/28/14   [provider]  progesterone (PROMETRIUM) 100 MG capsule Take 100 mg by mouth every other day. At night. 10/10/17   [provider]  UNABLE TO FIND Allergy shots    [provider]  UNABLE TO FIND Estrogen/testosterone pellet    [provider]  UNABLE TO FIND Med Name:Venom injections    [provider]  valACYclovir (VALTREX) 500 MG tablet Take one tablet (500 mg) by mouth daily for prevention.  Take one tablet by mouth twice a day for 3 days for an outbreak. 01/04/22   Patton Salles, MD      Allergies    Bee venom    Review of Systems   Review of Systems  Neurological:  Positive for dizziness.    Physical Exam Updated Vital Signs Ht 1.664 m (5' 5.5")   Wt 64.9 kg   LMP 02/06/2018 (Exact Date)   BMI 23.43 kg/m  Physical Exam Vitals and nursing note reviewed.  Constitutional:      General: She is not in acute distress.    Appearance: She is well-developed.  HENT:     Head: Normocephalic and atraumatic.     Right Ear: External ear normal.     Left Ear: External ear normal.  Eyes:     General: No visual field deficit or scleral icterus.       Right eye: No discharge.        Left eye: No discharge.     Conjunctiva/sclera: Conjunctivae normal.  Neck:     Trachea: No tracheal deviation.  Cardiovascular:     Rate and Rhythm: Normal rate and regular rhythm.  Pulmonary:     Effort: Pulmonary effort is normal. No respiratory distress.     Breath sounds:  Normal breath sounds. No stridor. No wheezing or rales.  Abdominal:     General: Bowel sounds are normal. There is no distension.     Palpations: Abdomen is soft.     Tenderness: There is no abdominal tenderness. There is no guarding or rebound.  Musculoskeletal:        General: No tenderness or deformity.     Cervical back: Neck supple.  Skin:    General: Skin is warm and dry.     Findings: No rash.  Neurological:     General: No focal deficit present.     Mental Status: She is alert and oriented to person, place, and time.     Cranial Nerves: No cranial nerve deficit, dysarthria or facial asymmetry.     Sensory: No sensory deficit.     Motor: No abnormal muscle tone, seizure activity or pronator drift.     Coordination: Coordination normal.     Comments:  able to hold both legs off bed for 5  seconds, sensation intact in all extremities,  no left or right sided neglect, normal finger-nose exam bilaterally, no nystagmus noted   Psychiatric:        Mood and Affect: Mood normal.     ED Results / Procedures / Treatments   Labs (all labs ordered are listed, but only abnormal results are displayed) Labs Reviewed  CBC - Abnormal; Notable for the following components:      Result Value   Platelets 146 (*)    All other components within normal limits  BASIC METABOLIC PANEL    EKG EKG Interpretation  Date/Time:  Sunday Oct 03 2022 14:46:37 EDT Ventricular Rate:  70 PR Interval:  156 QRS Duration: 146 QT Interval:  420 QTC Calculation: 454 R Axis:   92 Text Interpretation: Sinus rhythm Consider left atrial enlargement RBBB and LPFB No old tracing to compare Confirmed by Melene Plan [id number]) on 10/03/2022 2:50:08 PM  Radiology CT Head Wo Contrast  Result Date: 10/03/2022 CLINICAL DATA:  Dizziness. EXAM: CT HEAD WITHOUT CONTRAST TECHNIQUE: Contiguous axial images were obtained from the base of the skull through the vertex without intravenous contrast. RADIATION DOSE REDUCTION: This exam was performed according to the departmental dose-optimization program which includes automated exposure control, adjustment of the mA and/or kV according to patient size and/or use of iterative reconstruction technique. COMPARISON:  Maxillofacial CT 07/19/2022 FINDINGS: Brain: There is no evidence for acute hemorrhage, hydrocephalus, mass lesion, or abnormal extra-axial fluid collection. No definite CT evidence for acute infarction. Vascular: No hyperdense vessel or unexpected calcification. Skull: No evidence for fracture. No worrisome lytic or sclerotic lesion. Sinuses/Orbits: Similar subtotal opacification of the maxillary sinuses, ethmoid air cells, left frontal sinus. Surgical changes are noted in the maxillary sinuses bilaterally. No mastoid effusion. Visualized portions of the globes and  intraorbital fat are unremarkable. Other: None. IMPRESSION: 1. No acute intracranial abnormality. 2. Chronic paranasal sinus disease. Electronically Signed   By: Kennith Center M.D.   On: 10/03/2022 15:39    Procedures Procedures  {Document cardiac monitor, telemetry assessment procedure when appropriate:1}  Medications Ordered in ED Medications  meclizine (ANTIVERT) tablet 25 mg (25 mg Oral Given 10/03/22 1534)    ED Course/ Medical Decision Making/ A&P Clinical Course as of 10/03/22 1638  Sun Oct 03, 2022  1555 No acute findings on head CT other than chronic paranasal sinus disease [JK]  1556 CBC(!) White blood cell count normal.  Normal hemoglobin, platelets slightly decreased although I doubt clinically significant [  JK]  1627 Metabolic panel normal. [JK]    Clinical Course User Index [JK] Linwood Dibbles, MD   {   Click here for ABCD2, HEART and other calculatorsREFRESH Note before signing :1}                          Medical Decision Making Differential diagnosis includes but not limited to, peripheral vertigo, anemia, dehydration, stroke  Problems Addressed: Vertigo: acute illness or injury that poses a threat to life or bodily functions  Amount and/or Complexity of Data Reviewed Labs: ordered. Decision-making details documented in ED Course. Radiology: ordered and independent interpretation performed.   Patient presented to the ED with complaints of dizziness.  Symptoms suggestive of vertigo.  Patient feels worse with certain positions.  At rest she is feeling fine.  No focal deficits noted on exam.  CBC and metabolic panel without acute abnormalities.  Patient is not anemic.  No signs of severe dehydration.  Head CT does not show any acute abnormality.  Discussed with patient the limitations of CT excluding an acute stroke.  We do not have MRI available at this facility.  Her symptoms are more suggestive of peripheral vertigo.  Discussed options of symptomatic treatment versus  transfer to another medical facility where she can have an MRI to completely rule out acute stroke.  Patient feels comfortable discharge home and supportive care at this time.  {Document critical care time when appropriate:1} {Document review of labs and clinical decision tools ie heart score, Chads2Vasc2 etc:1}  {Document your independent review of radiology images, and any outside records:1} {Document your discussion with family members, caretakers, and with consultants:1} {Document social determinants of health affecting pt's care:1} {Document your decision making why or why not admission, treatments were needed:1} Final Clinical Impression(s) / ED Diagnoses Final diagnoses:  Vertigo    Rx / DC Orders ED Discharge Orders     None

## 2022-10-11 DIAGNOSIS — T63451D Toxic effect of venom of hornets, accidental (unintentional), subsequent encounter: Secondary | ICD-10-CM | POA: Diagnosis not present

## 2022-10-11 DIAGNOSIS — T63441D Toxic effect of venom of bees, accidental (unintentional), subsequent encounter: Secondary | ICD-10-CM | POA: Diagnosis not present

## 2022-10-11 DIAGNOSIS — T63461D Toxic effect of venom of wasps, accidental (unintentional), subsequent encounter: Secondary | ICD-10-CM | POA: Diagnosis not present

## 2022-10-11 DIAGNOSIS — N951 Menopausal and female climacteric states: Secondary | ICD-10-CM | POA: Diagnosis not present

## 2022-10-13 DIAGNOSIS — L538 Other specified erythematous conditions: Secondary | ICD-10-CM | POA: Diagnosis not present

## 2022-10-13 DIAGNOSIS — L918 Other hypertrophic disorders of the skin: Secondary | ICD-10-CM | POA: Diagnosis not present

## 2022-10-13 DIAGNOSIS — L680 Hirsutism: Secondary | ICD-10-CM | POA: Diagnosis not present

## 2022-10-13 DIAGNOSIS — L814 Other melanin hyperpigmentation: Secondary | ICD-10-CM | POA: Diagnosis not present

## 2022-10-13 DIAGNOSIS — R6882 Decreased libido: Secondary | ICD-10-CM | POA: Diagnosis not present

## 2022-10-13 DIAGNOSIS — R232 Flushing: Secondary | ICD-10-CM | POA: Diagnosis not present

## 2022-10-13 DIAGNOSIS — L570 Actinic keratosis: Secondary | ICD-10-CM | POA: Diagnosis not present

## 2022-10-13 DIAGNOSIS — Z6823 Body mass index (BMI) 23.0-23.9, adult: Secondary | ICD-10-CM | POA: Diagnosis not present

## 2022-10-13 DIAGNOSIS — L820 Inflamed seborrheic keratosis: Secondary | ICD-10-CM | POA: Diagnosis not present

## 2022-10-13 DIAGNOSIS — D225 Melanocytic nevi of trunk: Secondary | ICD-10-CM | POA: Diagnosis not present

## 2022-10-13 DIAGNOSIS — L821 Other seborrheic keratosis: Secondary | ICD-10-CM | POA: Diagnosis not present

## 2022-10-13 DIAGNOSIS — Z79890 Hormone replacement therapy (postmenopausal): Secondary | ICD-10-CM | POA: Diagnosis not present

## 2022-10-13 DIAGNOSIS — N951 Menopausal and female climacteric states: Secondary | ICD-10-CM | POA: Diagnosis not present

## 2022-10-14 DIAGNOSIS — J3089 Other allergic rhinitis: Secondary | ICD-10-CM | POA: Diagnosis not present

## 2022-10-14 DIAGNOSIS — J3081 Allergic rhinitis due to animal (cat) (dog) hair and dander: Secondary | ICD-10-CM | POA: Diagnosis not present

## 2022-10-14 DIAGNOSIS — J301 Allergic rhinitis due to pollen: Secondary | ICD-10-CM | POA: Diagnosis not present

## 2022-10-19 DIAGNOSIS — T63441D Toxic effect of venom of bees, accidental (unintentional), subsequent encounter: Secondary | ICD-10-CM | POA: Diagnosis not present

## 2022-10-19 DIAGNOSIS — T63461D Toxic effect of venom of wasps, accidental (unintentional), subsequent encounter: Secondary | ICD-10-CM | POA: Diagnosis not present

## 2022-10-19 DIAGNOSIS — T63451D Toxic effect of venom of hornets, accidental (unintentional), subsequent encounter: Secondary | ICD-10-CM | POA: Diagnosis not present

## 2022-10-26 DIAGNOSIS — T63451D Toxic effect of venom of hornets, accidental (unintentional), subsequent encounter: Secondary | ICD-10-CM | POA: Diagnosis not present

## 2022-10-26 DIAGNOSIS — T63441D Toxic effect of venom of bees, accidental (unintentional), subsequent encounter: Secondary | ICD-10-CM | POA: Diagnosis not present

## 2022-10-26 DIAGNOSIS — T63461D Toxic effect of venom of wasps, accidental (unintentional), subsequent encounter: Secondary | ICD-10-CM | POA: Diagnosis not present

## 2022-11-02 DIAGNOSIS — T63461D Toxic effect of venom of wasps, accidental (unintentional), subsequent encounter: Secondary | ICD-10-CM | POA: Diagnosis not present

## 2022-11-02 DIAGNOSIS — T63441D Toxic effect of venom of bees, accidental (unintentional), subsequent encounter: Secondary | ICD-10-CM | POA: Diagnosis not present

## 2022-11-02 DIAGNOSIS — T63451D Toxic effect of venom of hornets, accidental (unintentional), subsequent encounter: Secondary | ICD-10-CM | POA: Diagnosis not present

## 2022-11-09 DIAGNOSIS — T63461D Toxic effect of venom of wasps, accidental (unintentional), subsequent encounter: Secondary | ICD-10-CM | POA: Diagnosis not present

## 2022-11-09 DIAGNOSIS — T63441D Toxic effect of venom of bees, accidental (unintentional), subsequent encounter: Secondary | ICD-10-CM | POA: Diagnosis not present

## 2022-11-09 DIAGNOSIS — T63451D Toxic effect of venom of hornets, accidental (unintentional), subsequent encounter: Secondary | ICD-10-CM | POA: Diagnosis not present

## 2022-11-16 DIAGNOSIS — T63451D Toxic effect of venom of hornets, accidental (unintentional), subsequent encounter: Secondary | ICD-10-CM | POA: Diagnosis not present

## 2022-11-16 DIAGNOSIS — T63441D Toxic effect of venom of bees, accidental (unintentional), subsequent encounter: Secondary | ICD-10-CM | POA: Diagnosis not present

## 2022-11-16 DIAGNOSIS — T63461D Toxic effect of venom of wasps, accidental (unintentional), subsequent encounter: Secondary | ICD-10-CM | POA: Diagnosis not present

## 2022-12-15 DIAGNOSIS — T63461D Toxic effect of venom of wasps, accidental (unintentional), subsequent encounter: Secondary | ICD-10-CM | POA: Diagnosis not present

## 2022-12-15 DIAGNOSIS — T63451D Toxic effect of venom of hornets, accidental (unintentional), subsequent encounter: Secondary | ICD-10-CM | POA: Diagnosis not present

## 2022-12-15 DIAGNOSIS — T63441D Toxic effect of venom of bees, accidental (unintentional), subsequent encounter: Secondary | ICD-10-CM | POA: Diagnosis not present

## 2023-01-11 DIAGNOSIS — Z720 Tobacco use: Secondary | ICD-10-CM | POA: Diagnosis not present

## 2023-01-11 DIAGNOSIS — J301 Allergic rhinitis due to pollen: Secondary | ICD-10-CM | POA: Diagnosis not present

## 2023-01-11 DIAGNOSIS — Z91030 Bee allergy status: Secondary | ICD-10-CM | POA: Diagnosis not present

## 2023-01-11 DIAGNOSIS — Z833 Family history of diabetes mellitus: Secondary | ICD-10-CM | POA: Diagnosis not present

## 2023-01-11 DIAGNOSIS — Z79890 Hormone replacement therapy (postmenopausal): Secondary | ICD-10-CM | POA: Diagnosis not present

## 2023-01-11 DIAGNOSIS — Z87892 Personal history of anaphylaxis: Secondary | ICD-10-CM | POA: Diagnosis not present

## 2023-01-11 DIAGNOSIS — M1990 Unspecified osteoarthritis, unspecified site: Secondary | ICD-10-CM | POA: Diagnosis not present

## 2023-01-11 DIAGNOSIS — Z809 Family history of malignant neoplasm, unspecified: Secondary | ICD-10-CM | POA: Diagnosis not present

## 2023-01-11 DIAGNOSIS — Z791 Long term (current) use of non-steroidal anti-inflammatories (NSAID): Secondary | ICD-10-CM | POA: Diagnosis not present

## 2023-01-11 DIAGNOSIS — Z8249 Family history of ischemic heart disease and other diseases of the circulatory system: Secondary | ICD-10-CM | POA: Diagnosis not present

## 2023-01-11 DIAGNOSIS — Z008 Encounter for other general examination: Secondary | ICD-10-CM | POA: Diagnosis not present

## 2023-01-11 DIAGNOSIS — Z91199 Patient's noncompliance with other medical treatment and regimen due to unspecified reason: Secondary | ICD-10-CM | POA: Diagnosis not present

## 2023-01-14 DIAGNOSIS — T63461D Toxic effect of venom of wasps, accidental (unintentional), subsequent encounter: Secondary | ICD-10-CM | POA: Diagnosis not present

## 2023-01-14 DIAGNOSIS — T63441D Toxic effect of venom of bees, accidental (unintentional), subsequent encounter: Secondary | ICD-10-CM | POA: Diagnosis not present

## 2023-01-14 DIAGNOSIS — T63451D Toxic effect of venom of hornets, accidental (unintentional), subsequent encounter: Secondary | ICD-10-CM | POA: Diagnosis not present

## 2023-02-02 DIAGNOSIS — J324 Chronic pansinusitis: Secondary | ICD-10-CM | POA: Diagnosis not present

## 2023-02-02 DIAGNOSIS — J338 Other polyp of sinus: Secondary | ICD-10-CM | POA: Diagnosis not present

## 2023-02-16 DIAGNOSIS — T63441D Toxic effect of venom of bees, accidental (unintentional), subsequent encounter: Secondary | ICD-10-CM | POA: Diagnosis not present

## 2023-02-16 DIAGNOSIS — T63461D Toxic effect of venom of wasps, accidental (unintentional), subsequent encounter: Secondary | ICD-10-CM | POA: Diagnosis not present

## 2023-02-16 DIAGNOSIS — T63451D Toxic effect of venom of hornets, accidental (unintentional), subsequent encounter: Secondary | ICD-10-CM | POA: Diagnosis not present

## 2023-02-22 DIAGNOSIS — J301 Allergic rhinitis due to pollen: Secondary | ICD-10-CM | POA: Diagnosis not present

## 2023-02-22 DIAGNOSIS — T781XXA Other adverse food reactions, not elsewhere classified, initial encounter: Secondary | ICD-10-CM | POA: Diagnosis not present

## 2023-02-22 DIAGNOSIS — J3089 Other allergic rhinitis: Secondary | ICD-10-CM | POA: Diagnosis not present

## 2023-02-22 DIAGNOSIS — J3081 Allergic rhinitis due to animal (cat) (dog) hair and dander: Secondary | ICD-10-CM | POA: Diagnosis not present

## 2023-03-02 DIAGNOSIS — J301 Allergic rhinitis due to pollen: Secondary | ICD-10-CM | POA: Diagnosis not present

## 2023-03-02 DIAGNOSIS — J3089 Other allergic rhinitis: Secondary | ICD-10-CM | POA: Diagnosis not present

## 2023-03-02 DIAGNOSIS — J3081 Allergic rhinitis due to animal (cat) (dog) hair and dander: Secondary | ICD-10-CM | POA: Diagnosis not present

## 2023-03-14 ENCOUNTER — Other Ambulatory Visit (HOSPITAL_COMMUNITY): Payer: Self-pay | Admitting: Obstetrics and Gynecology

## 2023-03-14 DIAGNOSIS — Z1231 Encounter for screening mammogram for malignant neoplasm of breast: Secondary | ICD-10-CM

## 2023-03-22 DIAGNOSIS — T63441D Toxic effect of venom of bees, accidental (unintentional), subsequent encounter: Secondary | ICD-10-CM | POA: Diagnosis not present

## 2023-03-22 DIAGNOSIS — T63461D Toxic effect of venom of wasps, accidental (unintentional), subsequent encounter: Secondary | ICD-10-CM | POA: Diagnosis not present

## 2023-03-22 DIAGNOSIS — T63451D Toxic effect of venom of hornets, accidental (unintentional), subsequent encounter: Secondary | ICD-10-CM | POA: Diagnosis not present

## 2023-03-23 ENCOUNTER — Encounter (HOSPITAL_COMMUNITY): Payer: Self-pay

## 2023-03-23 ENCOUNTER — Ambulatory Visit (HOSPITAL_COMMUNITY)
Admission: RE | Admit: 2023-03-23 | Discharge: 2023-03-23 | Disposition: A | Discharge status: Home / Self Care | Payer: Medicare HMO | Source: Ambulatory Visit | Attending: Obstetrics and Gynecology | Admitting: Obstetrics and Gynecology

## 2023-03-23 DIAGNOSIS — Z1231 Encounter for screening mammogram for malignant neoplasm of breast: Secondary | ICD-10-CM | POA: Diagnosis not present

## 2023-04-19 DIAGNOSIS — L821 Other seborrheic keratosis: Secondary | ICD-10-CM | POA: Diagnosis not present

## 2023-04-19 DIAGNOSIS — D225 Melanocytic nevi of trunk: Secondary | ICD-10-CM | POA: Diagnosis not present

## 2023-04-19 DIAGNOSIS — L820 Inflamed seborrheic keratosis: Secondary | ICD-10-CM | POA: Diagnosis not present

## 2023-04-19 DIAGNOSIS — L565 Disseminated superficial actinic porokeratosis (DSAP): Secondary | ICD-10-CM | POA: Diagnosis not present

## 2023-04-19 DIAGNOSIS — L538 Other specified erythematous conditions: Secondary | ICD-10-CM | POA: Diagnosis not present

## 2023-04-19 DIAGNOSIS — L570 Actinic keratosis: Secondary | ICD-10-CM | POA: Diagnosis not present

## 2023-04-19 DIAGNOSIS — L814 Other melanin hyperpigmentation: Secondary | ICD-10-CM | POA: Diagnosis not present

## 2023-04-28 DIAGNOSIS — R69 Illness, unspecified: Secondary | ICD-10-CM | POA: Diagnosis not present

## 2023-05-03 DIAGNOSIS — R6882 Decreased libido: Secondary | ICD-10-CM | POA: Diagnosis not present

## 2023-05-03 DIAGNOSIS — N898 Other specified noninflammatory disorders of vagina: Secondary | ICD-10-CM | POA: Diagnosis not present

## 2023-05-03 DIAGNOSIS — Z6823 Body mass index (BMI) 23.0-23.9, adult: Secondary | ICD-10-CM | POA: Diagnosis not present

## 2023-05-03 DIAGNOSIS — M2550 Pain in unspecified joint: Secondary | ICD-10-CM | POA: Diagnosis not present

## 2023-05-03 DIAGNOSIS — R5383 Other fatigue: Secondary | ICD-10-CM | POA: Diagnosis not present

## 2023-05-03 DIAGNOSIS — N951 Menopausal and female climacteric states: Secondary | ICD-10-CM | POA: Diagnosis not present

## 2023-05-03 DIAGNOSIS — L680 Hirsutism: Secondary | ICD-10-CM | POA: Diagnosis not present

## 2023-05-03 DIAGNOSIS — Z79890 Hormone replacement therapy (postmenopausal): Secondary | ICD-10-CM | POA: Diagnosis not present

## 2023-05-03 DIAGNOSIS — R4586 Emotional lability: Secondary | ICD-10-CM | POA: Diagnosis not present

## 2023-06-06 DIAGNOSIS — M7522 Bicipital tendinitis, left shoulder: Secondary | ICD-10-CM | POA: Diagnosis not present

## 2023-06-06 DIAGNOSIS — M7542 Impingement syndrome of left shoulder: Secondary | ICD-10-CM | POA: Diagnosis not present

## 2023-06-06 DIAGNOSIS — M25512 Pain in left shoulder: Secondary | ICD-10-CM | POA: Diagnosis not present

## 2023-06-09 NOTE — Progress Notes (Signed)
68 y.o. G59P0000 Widowed Caucasian female here for a breast and pelvic exam.    The patient is also followed for HRT through [facility].  Estrogen/testosterone pellets every 4 months.  Takes Prometrium.   Denies vaginal bleeding.   Doing PT for torn rotator cuff on the night.   Needs refill of Valtrex.   Engaged. Mother passed.  Going on a cruise.  Working part time at an [employer].   PCP: Assunta Found, MD   Patient's last menstrual period was 02/06/2018 (exact date).           Sexually active: Yes.    The current method of family planning is post menopausal status.    Menopausal hormone therapy:  progesterone, estrogen/testosterone pellet Exercising: Yes.     Yoga, 3-5 miles a day Smoker:  yes  OB History     Gravida  0   Para  0   Term  0   Preterm  0   AB  0   Living  0      SAB  0   IAB  0   Ectopic  0   Multiple  0   Live Births              HEALTH MAINTENANCE: Last 2 paps: 12/25/20 neg: HR HPV neg, 12-15-17 Neg:Neg HR HPV History of abnormal Pap or positive HPV:  yes, 06-26-15 ASCUS:Neg HR HPV,  11-01-14 LGSIL, colpo HPV effect & benign endocervical glandular tissue.  Mammogram:  03/23/23 Breast Density Cat C, BI-RADS CAT 1 neg Colonoscopy:  10/30/21 - polyps - due in 2033 Bone Density:  01/27/22 -  osteopenia of spine and hip  Immunization History  Administered Date(s) Administered   Moderna Sars-Covid-2 Vaccination 02/26/2020   Tdap 05/24/2008, 12/18/2018      reports that she has been smoking cigarettes. She has never used smokeless tobacco. She reports current alcohol use of about 12.0 standard drinks of alcohol per week. She reports that she does not use drugs.  Past Medical History:  Diagnosis Date   Abdominal pain, epigastric    Abnormal Pap smear of cervix    11-01-14 LGSIL   Arthritis    in back   Asthma    as a child   Constipation    COVID-19 virus infection 08/2020   History of Salmonella infection    HSV infection     Irritable bowel syndrome    Melanoma of skin, site unspecified    PONV (postoperative nausea and vomiting)    Rotator cuff syndrome of left shoulder    Seasonal allergies    Shoulder pain     Past Surgical History:  Procedure Laterality Date   COLONOSCOPY WITH PROPOFOL N/A 10/30/2021   Procedure: COLONOSCOPY WITH PROPOFOL;  Surgeon: Lanelle Bal, DO;  Location: [facility];  Service: Endoscopy;  Laterality: N/A;  10:30am   COLPOSCOPY  2016   DILATION AND CURETTAGE OF UTERUS  1997   bleeding   MOLE REMOVAL     cancer   POLYPECTOMY  10/30/2021   Procedure: POLYPECTOMY;  Surgeon: Lanelle Bal, DO;  Location: [facility];  Service: Endoscopy;;   SKIN GRAFT     Thermal Ablation     of the uterus   TUBAL LIGATION      Current Outpatient Medications  Medication Sig Dispense Refill   albuterol (VENTOLIN HFA) 108 (90 Base) MCG/ACT inhaler Inhale 2 puffs into the lungs every 6 (six) hours as needed for wheezing  or shortness of breath. 8 g 0   Cyanocobalamin (VITAMIN B-12 SL) Place 3,000 mcg under the tongue in the morning.     EPINEPHrine 0.3 mg/0.3 mL IJ SOAJ injection Inject 0.3 mg into the muscle as needed for anaphylaxis.     fluticasone (FLONASE) 50 MCG/ACT nasal spray Place 1 spray into both nostrils daily as needed for allergies.     levocetirizine (XYZAL) 5 MG tablet Take 5 mg by mouth at bedtime.     montelukast (SINGULAIR) 10 MG tablet Take 10 mg by mouth in the morning.     progesterone (PROMETRIUM) 100 MG capsule Take 100 mg by mouth at bedtime.     UNABLE TO FIND Allergy shots     UNABLE TO FIND Estrogen/testosterone pellet     UNABLE TO FIND Med Name:Venom injections     valACYclovir (VALTREX) 500 MG tablet Take one tablet (500 mg) by mouth daily for prevention.  Take one tablet by mouth twice a day for 3 days for an outbreak. 110 tablet 3   No current facility-administered medications for this visit.    ALLERGIES: Bee venom  Family History  Problem  Relation Age of Onset   Hypertension Mother    Heart attack Mother    Fibroids Mother        h/x hysterectomy   Hypothyroidism Mother    Diabetes Mother    Acromegaly Mother    Alzheimer's disease Mother    Hypertension Father    Kidney Stones Father    Diabetes Father    Heart block Father        stint put in   Leukemia Brother    Diabetes Maternal Grandmother    Heart failure Maternal Grandmother    Depression Maternal Grandfather    Parkinson's disease Maternal Grandfather    Cancer Maternal Grandfather        brain   Breast cancer Paternal Aunt    Breast cancer Paternal Aunt    Colon cancer Neg Hx     Review of Systems  All other systems reviewed and are negative.   PHYSICAL EXAM:  BP 122/76 (BP Location: Left Arm, Patient Position: Sitting, Cuff Size: Small)   Pulse 62   Ht 5' 5.5" (1.664 m)   Wt 145 lb (65.8 kg)   LMP 02/06/2018 (Exact Date)   SpO2 98%   BMI 23.76 kg/m     General appearance: alert, cooperative and appears stated age Head: normocephalic, without obvious abnormality, atraumatic Neck: no adenopathy, supple, symmetrical, trachea midline and thyroid normal to inspection and palpation Lungs: clear to auscultation bilaterally Breasts: normal appearance, no masses or tenderness, No nipple retraction or dimpling, No nipple discharge or bleeding, No axillary adenopathy Heart: regular rate and rhythm Abdomen: soft, non-tender; no masses, no organomegaly Extremities: extremities normal, atraumatic, no cyanosis or edema Skin: skin color, texture, turgor normal. No rashes or lesions Lymph nodes: cervical, supraclavicular, and axillary nodes normal. Neurologic: grossly normal  Pelvic: External genitalia:  no lesions              No abnormal inguinal nodes palpated.              Urethra:  normal appearing urethra with no masses, tenderness or lesions              Bartholins and Skenes: normal                 Vagina: normal appearing vagina with normal  color and discharge, no lesions  Cervix: no lesions              Pap taken: Yes.   Bimanual Exam:  Uterus:  normal size, contour, position, consistency, mobility, non-tender              Adnexa: no mass, fullness, tenderness              Rectal exam: Yes.  .  Confirms.              Anus:  normal sphincter tone, no lesions  Chaperone was present for exam:  Warren Lacy, CMA  ASSESSMENT: Encounter for breast and pelvic exam.  Hx endometrial ablation.  HRT and [facility] tx at [facility].  Personal history of other medical treatment.  Hx HSV.  Uses Valtrex.  Encounter for medication monitoring. Cervical cancer screening.  Hx LGSIL. Osteopenia.  Hx melanoma.   PLAN: Mammogram screening discussed. Self breast awareness reviewed. Pap and HRV collected:  yes Guidelines for Calcium, Vitamin D, regular exercise program including cardiovascular and weight bearing exercise. Medication refills:  Valtrex 500 mg daily and bid x 3 days prn.  BMD at [facility] 01/2022. Follow up:  yearly and prn.     Additional counseling given.  yes. 25 min  total time was spent for this patient encounter, including preparation, face-to-face counseling with the patient, coordination of care, and documentation of the encounter in addition to doing the breast and pelvic exam.

## 2023-06-13 DIAGNOSIS — S46102D Unspecified injury of muscle, fascia and tendon of long head of biceps, left arm, subsequent encounter: Secondary | ICD-10-CM | POA: Diagnosis not present

## 2023-06-13 DIAGNOSIS — M5459 Other low back pain: Secondary | ICD-10-CM | POA: Diagnosis not present

## 2023-06-13 DIAGNOSIS — S46002D Unspecified injury of muscle(s) and tendon(s) of the rotator cuff of left shoulder, subsequent encounter: Secondary | ICD-10-CM | POA: Diagnosis not present

## 2023-06-16 DIAGNOSIS — M5459 Other low back pain: Secondary | ICD-10-CM | POA: Diagnosis not present

## 2023-06-16 DIAGNOSIS — S46002D Unspecified injury of muscle(s) and tendon(s) of the rotator cuff of left shoulder, subsequent encounter: Secondary | ICD-10-CM | POA: Diagnosis not present

## 2023-06-16 DIAGNOSIS — S46102D Unspecified injury of muscle, fascia and tendon of long head of biceps, left arm, subsequent encounter: Secondary | ICD-10-CM | POA: Diagnosis not present

## 2023-06-21 DIAGNOSIS — S46102D Unspecified injury of muscle, fascia and tendon of long head of biceps, left arm, subsequent encounter: Secondary | ICD-10-CM | POA: Diagnosis not present

## 2023-06-21 DIAGNOSIS — M5459 Other low back pain: Secondary | ICD-10-CM | POA: Diagnosis not present

## 2023-06-21 DIAGNOSIS — S46002D Unspecified injury of muscle(s) and tendon(s) of the rotator cuff of left shoulder, subsequent encounter: Secondary | ICD-10-CM | POA: Diagnosis not present

## 2023-06-23 ENCOUNTER — Ambulatory Visit (INDEPENDENT_AMBULATORY_CARE_PROVIDER_SITE_OTHER): Payer: Medicare HMO | Admitting: Obstetrics and Gynecology

## 2023-06-23 ENCOUNTER — Encounter: Payer: Self-pay | Admitting: Obstetrics and Gynecology

## 2023-06-23 ENCOUNTER — Other Ambulatory Visit (HOSPITAL_COMMUNITY)
Admission: RE | Admit: 2023-06-23 | Discharge: 2023-06-23 | Disposition: A | Discharge status: Home / Self Care | Payer: Medicare HMO | Source: Ambulatory Visit | Attending: Obstetrics and Gynecology | Admitting: Obstetrics and Gynecology

## 2023-06-23 VITALS — BP 122/76 | HR 62 | Ht 65.5 in | Wt 145.0 lb

## 2023-06-23 DIAGNOSIS — Z01419 Encounter for gynecological examination (general) (routine) without abnormal findings: Secondary | ICD-10-CM | POA: Insufficient documentation

## 2023-06-23 DIAGNOSIS — M5459 Other low back pain: Secondary | ICD-10-CM | POA: Diagnosis not present

## 2023-06-23 DIAGNOSIS — Z124 Encounter for screening for malignant neoplasm of cervix: Secondary | ICD-10-CM

## 2023-06-23 DIAGNOSIS — B009 Herpesviral infection, unspecified: Secondary | ICD-10-CM

## 2023-06-23 DIAGNOSIS — Z9189 Other specified personal risk factors, not elsewhere classified: Secondary | ICD-10-CM | POA: Diagnosis not present

## 2023-06-23 DIAGNOSIS — S46102D Unspecified injury of muscle, fascia and tendon of long head of biceps, left arm, subsequent encounter: Secondary | ICD-10-CM | POA: Diagnosis not present

## 2023-06-23 DIAGNOSIS — Z1151 Encounter for screening for human papillomavirus (HPV): Secondary | ICD-10-CM | POA: Insufficient documentation

## 2023-06-23 DIAGNOSIS — S46002D Unspecified injury of muscle(s) and tendon(s) of the rotator cuff of left shoulder, subsequent encounter: Secondary | ICD-10-CM | POA: Diagnosis not present

## 2023-06-23 DIAGNOSIS — Z780 Asymptomatic menopausal state: Secondary | ICD-10-CM

## 2023-06-23 DIAGNOSIS — Z5181 Encounter for therapeutic drug level monitoring: Secondary | ICD-10-CM

## 2023-06-23 DIAGNOSIS — Z9289 Personal history of other medical treatment: Secondary | ICD-10-CM | POA: Diagnosis not present

## 2023-06-23 DIAGNOSIS — M8580 Other specified disorders of bone density and structure, unspecified site: Secondary | ICD-10-CM

## 2023-06-23 MED ORDER — VALACYCLOVIR HCL 500 MG PO TABS: ORAL_TABLET | ORAL | 3 refills | Status: DC

## 2023-06-23 NOTE — Patient Instructions (Signed)

## 2023-06-28 ENCOUNTER — Encounter: Payer: Self-pay | Admitting: Obstetrics and Gynecology

## 2023-06-28 LAB — CYTOLOGY - PAP
Adequacy: ABSENT
Comment: NEGATIVE
Diagnosis: NEGATIVE
High risk HPV: NEGATIVE

## 2023-07-04 DIAGNOSIS — S46102D Unspecified injury of muscle, fascia and tendon of long head of biceps, left arm, subsequent encounter: Secondary | ICD-10-CM | POA: Diagnosis not present

## 2023-07-04 DIAGNOSIS — M5459 Other low back pain: Secondary | ICD-10-CM | POA: Diagnosis not present

## 2023-07-04 DIAGNOSIS — S46002D Unspecified injury of muscle(s) and tendon(s) of the rotator cuff of left shoulder, subsequent encounter: Secondary | ICD-10-CM | POA: Diagnosis not present

## 2023-07-07 DIAGNOSIS — S46002D Unspecified injury of muscle(s) and tendon(s) of the rotator cuff of left shoulder, subsequent encounter: Secondary | ICD-10-CM | POA: Diagnosis not present

## 2023-07-07 DIAGNOSIS — S46102D Unspecified injury of muscle, fascia and tendon of long head of biceps, left arm, subsequent encounter: Secondary | ICD-10-CM | POA: Diagnosis not present

## 2023-07-07 DIAGNOSIS — M5459 Other low back pain: Secondary | ICD-10-CM | POA: Diagnosis not present

## 2023-07-12 DIAGNOSIS — M5459 Other low back pain: Secondary | ICD-10-CM | POA: Diagnosis not present

## 2023-07-12 DIAGNOSIS — S46102D Unspecified injury of muscle, fascia and tendon of long head of biceps, left arm, subsequent encounter: Secondary | ICD-10-CM | POA: Diagnosis not present

## 2023-07-12 DIAGNOSIS — S46002D Unspecified injury of muscle(s) and tendon(s) of the rotator cuff of left shoulder, subsequent encounter: Secondary | ICD-10-CM | POA: Diagnosis not present

## 2023-07-13 DIAGNOSIS — R030 Elevated blood-pressure reading, without diagnosis of hypertension: Secondary | ICD-10-CM | POA: Diagnosis not present

## 2023-07-13 DIAGNOSIS — Z87892 Personal history of anaphylaxis: Secondary | ICD-10-CM | POA: Diagnosis not present

## 2023-07-13 DIAGNOSIS — Z833 Family history of diabetes mellitus: Secondary | ICD-10-CM | POA: Diagnosis not present

## 2023-07-13 DIAGNOSIS — Z809 Family history of malignant neoplasm, unspecified: Secondary | ICD-10-CM | POA: Diagnosis not present

## 2023-07-13 DIAGNOSIS — M1990 Unspecified osteoarthritis, unspecified site: Secondary | ICD-10-CM | POA: Diagnosis not present

## 2023-07-13 DIAGNOSIS — Z91038 Other insect allergy status: Secondary | ICD-10-CM | POA: Diagnosis not present

## 2023-07-13 DIAGNOSIS — Z85828 Personal history of other malignant neoplasm of skin: Secondary | ICD-10-CM | POA: Diagnosis not present

## 2023-07-13 DIAGNOSIS — Z8249 Family history of ischemic heart disease and other diseases of the circulatory system: Secondary | ICD-10-CM | POA: Diagnosis not present

## 2023-07-13 DIAGNOSIS — J301 Allergic rhinitis due to pollen: Secondary | ICD-10-CM | POA: Diagnosis not present

## 2023-07-13 DIAGNOSIS — N181 Chronic kidney disease, stage 1: Secondary | ICD-10-CM | POA: Diagnosis not present

## 2023-07-13 DIAGNOSIS — M545 Low back pain, unspecified: Secondary | ICD-10-CM | POA: Diagnosis not present

## 2023-07-13 DIAGNOSIS — Z720 Tobacco use: Secondary | ICD-10-CM | POA: Diagnosis not present

## 2023-07-14 DIAGNOSIS — S46102D Unspecified injury of muscle, fascia and tendon of long head of biceps, left arm, subsequent encounter: Secondary | ICD-10-CM | POA: Diagnosis not present

## 2023-07-14 DIAGNOSIS — S46002D Unspecified injury of muscle(s) and tendon(s) of the rotator cuff of left shoulder, subsequent encounter: Secondary | ICD-10-CM | POA: Diagnosis not present

## 2023-07-14 DIAGNOSIS — M5459 Other low back pain: Secondary | ICD-10-CM | POA: Diagnosis not present

## 2023-07-19 DIAGNOSIS — S46002D Unspecified injury of muscle(s) and tendon(s) of the rotator cuff of left shoulder, subsequent encounter: Secondary | ICD-10-CM | POA: Diagnosis not present

## 2023-07-19 DIAGNOSIS — S46102D Unspecified injury of muscle, fascia and tendon of long head of biceps, left arm, subsequent encounter: Secondary | ICD-10-CM | POA: Diagnosis not present

## 2023-07-19 DIAGNOSIS — M5459 Other low back pain: Secondary | ICD-10-CM | POA: Diagnosis not present

## 2023-07-22 DIAGNOSIS — S46002D Unspecified injury of muscle(s) and tendon(s) of the rotator cuff of left shoulder, subsequent encounter: Secondary | ICD-10-CM | POA: Diagnosis not present

## 2023-07-22 DIAGNOSIS — S46102D Unspecified injury of muscle, fascia and tendon of long head of biceps, left arm, subsequent encounter: Secondary | ICD-10-CM | POA: Diagnosis not present

## 2023-07-22 DIAGNOSIS — M5459 Other low back pain: Secondary | ICD-10-CM | POA: Diagnosis not present

## 2023-07-26 DIAGNOSIS — M5459 Other low back pain: Secondary | ICD-10-CM | POA: Diagnosis not present

## 2023-07-26 DIAGNOSIS — S46002D Unspecified injury of muscle(s) and tendon(s) of the rotator cuff of left shoulder, subsequent encounter: Secondary | ICD-10-CM | POA: Diagnosis not present

## 2023-07-26 DIAGNOSIS — S46102D Unspecified injury of muscle, fascia and tendon of long head of biceps, left arm, subsequent encounter: Secondary | ICD-10-CM | POA: Diagnosis not present

## 2023-07-28 DIAGNOSIS — S46102D Unspecified injury of muscle, fascia and tendon of long head of biceps, left arm, subsequent encounter: Secondary | ICD-10-CM | POA: Diagnosis not present

## 2023-07-28 DIAGNOSIS — S46002D Unspecified injury of muscle(s) and tendon(s) of the rotator cuff of left shoulder, subsequent encounter: Secondary | ICD-10-CM | POA: Diagnosis not present

## 2023-07-28 DIAGNOSIS — M5459 Other low back pain: Secondary | ICD-10-CM | POA: Diagnosis not present

## 2023-08-01 DIAGNOSIS — S43432A Superior glenoid labrum lesion of left shoulder, initial encounter: Secondary | ICD-10-CM | POA: Diagnosis not present

## 2023-08-01 DIAGNOSIS — M67912 Unspecified disorder of synovium and tendon, left shoulder: Secondary | ICD-10-CM | POA: Diagnosis not present

## 2023-08-05 ENCOUNTER — Telehealth (INDEPENDENT_AMBULATORY_CARE_PROVIDER_SITE_OTHER): Payer: Self-pay | Admitting: Otolaryngology

## 2023-08-05 NOTE — Telephone Encounter (Signed)
 Confirmed appt & location [id number]

## 2023-08-08 ENCOUNTER — Ambulatory Visit (INDEPENDENT_AMBULATORY_CARE_PROVIDER_SITE_OTHER): Payer: Medicare HMO

## 2023-08-08 ENCOUNTER — Encounter (INDEPENDENT_AMBULATORY_CARE_PROVIDER_SITE_OTHER): Payer: Self-pay

## 2023-08-08 VITALS — BP 126/70 | HR 68 | Ht 65.0 in | Wt 141.0 lb

## 2023-08-08 DIAGNOSIS — J338 Other polyp of sinus: Secondary | ICD-10-CM

## 2023-08-08 DIAGNOSIS — J343 Hypertrophy of nasal turbinates: Secondary | ICD-10-CM

## 2023-08-08 DIAGNOSIS — J329 Chronic sinusitis, unspecified: Secondary | ICD-10-CM

## 2023-08-08 DIAGNOSIS — R0981 Nasal congestion: Secondary | ICD-10-CM | POA: Diagnosis not present

## 2023-08-08 DIAGNOSIS — J324 Chronic pansinusitis: Secondary | ICD-10-CM

## 2023-08-08 NOTE — Progress Notes (Unsigned)
 Patient ID: [name], female   DOB: Sep 26, 1955, 68 y.o.   MRN: [id number]  Follow-up: Chronic rhinosinusitis and polyposis  HPI: The patient is a 68 year old female who returns today for her follow-up evaluation.  The patient has a history of bilateral chronic rhinosinusitis, polyposis, and chronic nasal congestion.  She underwent bilateral endoscopic sinus surgery and turbinate reduction in March 2024.  The patient returns today reporting no sinus infection since her last visit.  Her nasal congestion has increased over the past 3 weeks.  Currently she denies any facial pain, fever, or visual change.  Exam: General: Communicates without difficulty, well nourished, no acute distress. Head: Normocephalic, no evidence injury, no tenderness, facial buttresses intact without stepoff. Face/sinus: No tenderness to palpation and percussion. Facial movement is normal and symmetric. Eyes: PERRL, EOMI. No scleral icterus, conjunctivae clear. Neuro: CN II exam reveals vision grossly intact.  No nystagmus at any point of gaze. Ears: Auricles well formed without lesions.  Ear canals are intact without mass or lesion.  No erythema or edema is appreciated.  The TMs are intact without fluid. Nose: External evaluation reveals normal support and skin without lesions.  Dorsum is intact.  Anterior rhinoscopy reveals congested mucosa over anterior aspect of inferior turbinates and intact septum.  No acute infection is noted.  Oral:  Oral cavity and oropharynx are intact, symmetric, without erythema or edema.  Mucosa is moist without lesions. Neck: Full range of motion without pain.  There is no significant lymphadenopathy.  No masses palpable.  Thyroid bed within normal limits to palpation.  Parotid glands and submandibular glands equal bilaterally without mass.  Trachea is midline. Neuro:  CN 2-12 grossly intact.   Assessment: 1.  Chronic rhinitis with nasal mucosal congestion.   2.  No recurrent sinusitis or polyposis is  noted today. 3.  The turbinates are well-healed.  Both nasal passageways are patent.  Plan: 1.  The physical exam findings are reviewed with the patient. 2.  Continue Flonase nasal spray 2 sprays each nostril daily. 3.  Nasal saline irrigation as needed. 4.  The patient will return for reevaluation in 6 months.

## 2023-08-09 DIAGNOSIS — J324 Chronic pansinusitis: Secondary | ICD-10-CM | POA: Insufficient documentation

## 2023-08-09 DIAGNOSIS — J338 Other polyp of sinus: Secondary | ICD-10-CM | POA: Insufficient documentation

## 2023-08-09 DIAGNOSIS — J343 Hypertrophy of nasal turbinates: Secondary | ICD-10-CM | POA: Insufficient documentation

## 2023-08-10 DIAGNOSIS — M25512 Pain in left shoulder: Secondary | ICD-10-CM | POA: Diagnosis not present

## 2023-08-26 DIAGNOSIS — M25512 Pain in left shoulder: Secondary | ICD-10-CM | POA: Diagnosis not present

## 2023-08-26 DIAGNOSIS — J301 Allergic rhinitis due to pollen: Secondary | ICD-10-CM | POA: Diagnosis not present

## 2023-08-26 DIAGNOSIS — J3081 Allergic rhinitis due to animal (cat) (dog) hair and dander: Secondary | ICD-10-CM | POA: Diagnosis not present

## 2023-08-26 DIAGNOSIS — J3089 Other allergic rhinitis: Secondary | ICD-10-CM | POA: Diagnosis not present

## 2023-09-01 DIAGNOSIS — J3081 Allergic rhinitis due to animal (cat) (dog) hair and dander: Secondary | ICD-10-CM | POA: Diagnosis not present

## 2023-09-01 DIAGNOSIS — Z6823 Body mass index (BMI) 23.0-23.9, adult: Secondary | ICD-10-CM | POA: Diagnosis not present

## 2023-09-01 DIAGNOSIS — R5383 Other fatigue: Secondary | ICD-10-CM | POA: Diagnosis not present

## 2023-09-01 DIAGNOSIS — N951 Menopausal and female climacteric states: Secondary | ICD-10-CM | POA: Diagnosis not present

## 2023-09-01 DIAGNOSIS — J3089 Other allergic rhinitis: Secondary | ICD-10-CM | POA: Diagnosis not present

## 2023-09-01 DIAGNOSIS — Z79890 Hormone replacement therapy (postmenopausal): Secondary | ICD-10-CM | POA: Diagnosis not present

## 2023-09-01 DIAGNOSIS — L680 Hirsutism: Secondary | ICD-10-CM | POA: Diagnosis not present

## 2023-09-01 DIAGNOSIS — R6882 Decreased libido: Secondary | ICD-10-CM | POA: Diagnosis not present

## 2023-09-01 DIAGNOSIS — R4586 Emotional lability: Secondary | ICD-10-CM | POA: Diagnosis not present

## 2023-09-01 DIAGNOSIS — M2550 Pain in unspecified joint: Secondary | ICD-10-CM | POA: Diagnosis not present

## 2023-09-01 DIAGNOSIS — N898 Other specified noninflammatory disorders of vagina: Secondary | ICD-10-CM | POA: Diagnosis not present

## 2023-09-01 DIAGNOSIS — J301 Allergic rhinitis due to pollen: Secondary | ICD-10-CM | POA: Diagnosis not present

## 2023-09-05 DIAGNOSIS — J3081 Allergic rhinitis due to animal (cat) (dog) hair and dander: Secondary | ICD-10-CM | POA: Diagnosis not present

## 2023-09-05 DIAGNOSIS — J3089 Other allergic rhinitis: Secondary | ICD-10-CM | POA: Diagnosis not present

## 2023-09-05 DIAGNOSIS — J301 Allergic rhinitis due to pollen: Secondary | ICD-10-CM | POA: Diagnosis not present

## 2023-12-01 DIAGNOSIS — R232 Flushing: Secondary | ICD-10-CM | POA: Diagnosis not present

## 2023-12-01 DIAGNOSIS — M2550 Pain in unspecified joint: Secondary | ICD-10-CM | POA: Diagnosis not present

## 2023-12-01 DIAGNOSIS — N951 Menopausal and female climacteric states: Secondary | ICD-10-CM | POA: Diagnosis not present

## 2023-12-01 DIAGNOSIS — Z6823 Body mass index (BMI) 23.0-23.9, adult: Secondary | ICD-10-CM | POA: Diagnosis not present

## 2024-02-13 ENCOUNTER — Ambulatory Visit (INDEPENDENT_AMBULATORY_CARE_PROVIDER_SITE_OTHER): Admitting: Otolaryngology

## 2024-02-13 ENCOUNTER — Encounter (INDEPENDENT_AMBULATORY_CARE_PROVIDER_SITE_OTHER): Payer: Self-pay | Admitting: Otolaryngology

## 2024-02-13 VITALS — BP 115/68 | HR 57 | Temp 97.9°F | Ht 65.0 in | Wt 143.0 lb

## 2024-02-13 DIAGNOSIS — J310 Chronic rhinitis: Secondary | ICD-10-CM | POA: Diagnosis not present

## 2024-02-13 DIAGNOSIS — J324 Chronic pansinusitis: Secondary | ICD-10-CM

## 2024-02-13 DIAGNOSIS — R0981 Nasal congestion: Secondary | ICD-10-CM

## 2024-02-13 DIAGNOSIS — J343 Hypertrophy of nasal turbinates: Secondary | ICD-10-CM

## 2024-02-13 DIAGNOSIS — J338 Other polyp of sinus: Secondary | ICD-10-CM

## 2024-02-13 NOTE — Progress Notes (Signed)
 Patient ID: [name], female   DOB: Oct 11, 1955, 68 y.o.   MRN: [id number]  Follow-up: Chronic rhinosinusitis and polyposis  HPI: The patient is a 68 year old female who returns today for her follow-up evaluation.  The patient has a history of bilateral chronic rhinosinusitis, polyposis, and chronic nasal congestion.  She underwent bilateral endoscopic sinus surgery and turbinate reduction in March 2024.  The patient returns today reporting a COVID infection in July 2025.  It did not develop into a sinusitis.  Her nasal congestion has improved with the use of Flonase nasal spray daily. Currently she denies any facial pain, fever, or visual change.  Exam: General: Communicates without difficulty, well nourished, no acute distress. Head: Normocephalic, no evidence injury, no tenderness, facial buttresses intact without stepoff. Face/sinus: No tenderness to palpation and percussion. Facial movement is normal and symmetric. Eyes: PERRL, EOMI. No scleral icterus, conjunctivae clear. Neuro: CN II exam reveals vision grossly intact.  No nystagmus at any point of gaze. Ears: Auricles well formed without lesions.  Ear canals are intact without mass or lesion.  No erythema or edema is appreciated.  The TMs are intact without fluid. Nose: External evaluation reveals normal support and skin without lesions.  Dorsum is intact.  Anterior rhinoscopy reveals congested mucosa over anterior aspect of inferior turbinates and intact septum.  No acute infection is noted.  Oral:  Oral cavity and oropharynx are intact, symmetric, without erythema or edema.  Mucosa is moist without lesions. Neck: Full range of motion without pain.  There is no significant lymphadenopathy.  No masses palpable.  Thyroid  bed within normal limits to palpation.  Parotid glands and submandibular glands equal bilaterally without mass.  Trachea is midline. Neuro:  CN 2-12 grossly intact.   Assessment: 1.  Chronic rhinitis with nasal mucosal congestion.    2.  No recurrent sinusitis or polyposis is noted today. 3.  The turbinates are well-healed.  Both nasal passageways are patent. 4.  Recent COVID infection.  No residual infection is noted today.  Plan: 1.  The physical exam findings are reviewed with the patient. 2.  Continue Flonase nasal spray 2 sprays each nostril daily. 3.  Nasal saline irrigation as needed. 4.  The patient will return for reevaluation in 6 months.

## 2024-02-28 DIAGNOSIS — J301 Allergic rhinitis due to pollen: Secondary | ICD-10-CM | POA: Diagnosis not present

## 2024-02-28 DIAGNOSIS — J3081 Allergic rhinitis due to animal (cat) (dog) hair and dander: Secondary | ICD-10-CM | POA: Diagnosis not present

## 2024-02-28 DIAGNOSIS — T63441D Toxic effect of venom of bees, accidental (unintentional), subsequent encounter: Secondary | ICD-10-CM | POA: Diagnosis not present

## 2024-02-28 DIAGNOSIS — J3089 Other allergic rhinitis: Secondary | ICD-10-CM | POA: Diagnosis not present

## 2024-03-01 DIAGNOSIS — M2550 Pain in unspecified joint: Secondary | ICD-10-CM | POA: Diagnosis not present

## 2024-03-01 DIAGNOSIS — N951 Menopausal and female climacteric states: Secondary | ICD-10-CM | POA: Diagnosis not present

## 2024-03-01 DIAGNOSIS — Z79890 Hormone replacement therapy (postmenopausal): Secondary | ICD-10-CM | POA: Diagnosis not present

## 2024-03-01 DIAGNOSIS — Z6823 Body mass index (BMI) 23.0-23.9, adult: Secondary | ICD-10-CM | POA: Diagnosis not present

## 2024-03-01 DIAGNOSIS — R5383 Other fatigue: Secondary | ICD-10-CM | POA: Diagnosis not present

## 2024-04-25 DIAGNOSIS — D485 Neoplasm of uncertain behavior of skin: Secondary | ICD-10-CM | POA: Diagnosis not present

## 2024-04-25 DIAGNOSIS — L578 Other skin changes due to chronic exposure to nonionizing radiation: Secondary | ICD-10-CM | POA: Diagnosis not present

## 2024-04-25 DIAGNOSIS — L814 Other melanin hyperpigmentation: Secondary | ICD-10-CM | POA: Diagnosis not present

## 2024-04-25 DIAGNOSIS — L538 Other specified erythematous conditions: Secondary | ICD-10-CM | POA: Diagnosis not present

## 2024-04-25 DIAGNOSIS — L719 Rosacea, unspecified: Secondary | ICD-10-CM | POA: Diagnosis not present

## 2024-04-25 DIAGNOSIS — L820 Inflamed seborrheic keratosis: Secondary | ICD-10-CM | POA: Diagnosis not present

## 2024-04-25 DIAGNOSIS — L300 Nummular dermatitis: Secondary | ICD-10-CM | POA: Diagnosis not present

## 2024-04-25 DIAGNOSIS — D1801 Hemangioma of skin and subcutaneous tissue: Secondary | ICD-10-CM | POA: Diagnosis not present

## 2024-04-25 DIAGNOSIS — L821 Other seborrheic keratosis: Secondary | ICD-10-CM | POA: Diagnosis not present

## 2024-04-25 DIAGNOSIS — L720 Epidermal cyst: Secondary | ICD-10-CM | POA: Diagnosis not present

## 2024-04-25 DIAGNOSIS — L570 Actinic keratosis: Secondary | ICD-10-CM | POA: Diagnosis not present

## 2024-04-27 DIAGNOSIS — H43393 Other vitreous opacities, bilateral: Secondary | ICD-10-CM | POA: Diagnosis not present

## 2024-04-27 DIAGNOSIS — Z0100 Encounter for examination of eyes and vision without abnormal findings: Secondary | ICD-10-CM | POA: Diagnosis not present

## 2024-04-27 DIAGNOSIS — H524 Presbyopia: Secondary | ICD-10-CM | POA: Diagnosis not present

## 2024-05-03 ENCOUNTER — Other Ambulatory Visit (HOSPITAL_COMMUNITY): Payer: Self-pay | Admitting: Family Medicine

## 2024-05-03 DIAGNOSIS — Z1231 Encounter for screening mammogram for malignant neoplasm of breast: Secondary | ICD-10-CM

## 2024-05-09 ENCOUNTER — Inpatient Hospital Stay (HOSPITAL_COMMUNITY): Admission: RE | Admit: 2024-05-09 | Discharge: 2024-05-09 | Attending: Family Medicine | Admitting: Family Medicine

## 2024-05-09 ENCOUNTER — Inpatient Hospital Stay (HOSPITAL_COMMUNITY)
Admission: RE | Admit: 2024-05-09 | Discharge: 2024-05-09 | Attending: Obstetrics and Gynecology | Admitting: Obstetrics and Gynecology

## 2024-05-09 ENCOUNTER — Encounter (HOSPITAL_COMMUNITY): Payer: Self-pay

## 2024-05-09 DIAGNOSIS — Z1231 Encounter for screening mammogram for malignant neoplasm of breast: Secondary | ICD-10-CM | POA: Insufficient documentation

## 2024-05-09 DIAGNOSIS — M8580 Other specified disorders of bone density and structure, unspecified site: Secondary | ICD-10-CM | POA: Diagnosis not present

## 2024-05-09 DIAGNOSIS — M8589 Other specified disorders of bone density and structure, multiple sites: Secondary | ICD-10-CM | POA: Diagnosis not present

## 2024-05-09 DIAGNOSIS — Z780 Asymptomatic menopausal state: Secondary | ICD-10-CM | POA: Insufficient documentation

## 2024-05-11 ENCOUNTER — Encounter: Payer: Self-pay | Admitting: Obstetrics and Gynecology

## 2024-05-22 ENCOUNTER — Ambulatory Visit
Admission: RE | Admit: 2024-05-22 | Discharge: 2024-05-22 | Disposition: A | Discharge status: Home / Self Care | Payer: Self-pay | Source: Ambulatory Visit | Attending: Nurse Practitioner

## 2024-05-22 VITALS — BP 138/82 | HR 65 | Temp 97.5°F | Resp 19

## 2024-05-22 DIAGNOSIS — R0989 Other specified symptoms and signs involving the circulatory and respiratory systems: Secondary | ICD-10-CM

## 2024-05-22 DIAGNOSIS — Z8709 Personal history of other diseases of the respiratory system: Secondary | ICD-10-CM | POA: Diagnosis not present

## 2024-05-22 DIAGNOSIS — J0140 Acute pansinusitis, unspecified: Secondary | ICD-10-CM

## 2024-05-22 LAB — POC SOFIA SARS ANTIGEN FIA: SARS Coronavirus 2 Ag: NEGATIVE

## 2024-05-22 MED ORDER — FLUCONAZOLE 150 MG PO TABS: ORAL_TABLET | ORAL | 0 refills | Status: DC

## 2024-05-22 MED ORDER — AZELASTINE HCL 0.1 % NA SOLN: 2.0000 | Freq: Two times a day (BID) | NASAL | 0 refills | Status: AC

## 2024-05-22 MED ORDER — AMOXICILLIN-POT CLAVULANATE 875-125 MG PO TABS: 1.0000 | ORAL_TABLET | Freq: Two times a day (BID) | ORAL | 0 refills | Status: DC

## 2024-05-22 NOTE — ED Triage Notes (Signed)
 Pt reports that since Friday had sinus congestion and pressure. Took 2 of her sister's steroids that helped little.  Mucinex, montelukast, tylenol as well tried.

## 2024-05-22 NOTE — Discharge Instructions (Addendum)
 Take medication as directed. Increase fluids and get plenty of rest. You may take over-the-counter Tylenol as needed for pain, fever, or general discomfort. Recommend normal saline nasal spray to help with nasal congestion throughout the day. For your cough, it may be helpful to use a humidifier at bedtime during sleep. If symptoms fail to improve with this treatment, you may follow-up in this clinic, with ENT, or with your primary care physician for further evaluation. Follow-up as needed.

## 2024-05-22 NOTE — ED Provider Notes (Signed)
 " [facility]    CSN: [id number] Arrival date & time: 05/22/24  9148      History   Chief Complaint Chief Complaint  Patient presents with   Appointment    HPI [name] is a 68 y.o. female.   The history is provided by the patient.   Patient presents for a several day history of sinus pain, sinus pressure, nasal congestion, postnasal drainage, headache, subjective fever, and a mild cough.  Denies ear pain, ear drainage, wheezing, difficulty breathing, chest pain, abdominal pain, nausea, vomiting, diarrhea, or rash.  Per review of the patient's chart patient with prior history of chronic pansinusitis.  States that she has been taking Mucinex, Singulair, and Flonase with minimal relief.  States that she also took steroids that her sister gave her with some relief.  Patient reports prior history of sinus surgery.  Past Medical History:  Diagnosis Date   Abdominal pain, epigastric    Abnormal Pap smear of cervix    11-01-14 LGSIL   Arthritis    in back   Asthma    as a child   Constipation    COVID-19 virus infection 08/2020   History of Salmonella infection    HSV infection    Irritable bowel syndrome    Melanoma of skin, site unspecified    Osteopenia 2025   bilateral hips and spine   PONV (postoperative nausea and vomiting)    Rotator cuff syndrome of left shoulder    Seasonal allergies    Shoulder pain     Patient Active Problem List   Diagnosis Date Noted   Chronic pansinusitis 08/09/2023   Hypertrophy of nasal turbinates 08/09/2023   Polyp of nasal sinus 08/09/2023   Colon cancer screening 09/30/2021   History of Salmonella infection 07/10/2009   Melanoma of skin (HCC) 07/10/2009   Constipation 07/10/2009   ABDOMINAL PAIN, EPIGASTRIC 07/10/2009   IRRITABLE BOWEL SYNDROME, HX OF 07/10/2009   SHOULDER PAIN 06/07/2007   Disorder of bursae and tendons in shoulder region 06/07/2007    Past Surgical History:  Procedure Laterality Date    COLONOSCOPY WITH PROPOFOL  N/A 10/30/2021   Procedure: COLONOSCOPY WITH PROPOFOL ;  Surgeon: Cindie Carlin POUR, DO;  Location: [facility];  Service: Endoscopy;  Laterality: N/A;  10:30am   COLPOSCOPY  2016   DILATION AND CURETTAGE OF UTERUS  1997   bleeding   MOLE REMOVAL     cancer   POLYPECTOMY  10/30/2021   Procedure: POLYPECTOMY;  Surgeon: Cindie Carlin POUR, DO;  Location: [facility];  Service: Endoscopy;;   SKIN GRAFT     Thermal Ablation     of the uterus   TUBAL LIGATION      OB History     Gravida  0   Para  0   Term  0   Preterm  0   AB  0   Living  0      SAB  0   IAB  0   Ectopic  0   Multiple  0   Live Births               Home Medications    Prior to Admission medications  Medication Sig Start Date End Date Taking? Authorizing Provider  amoxicillin-clavulanate (AUGMENTIN) 875-125 MG tablet Take 1 tablet by mouth every 12 (twelve) hours. 05/22/24  Yes Leath-Warren, Etta PARAS, NP  azelastine (ASTELIN) 0.1 % nasal spray Place 2 sprays into both nostrils 2 (two) times  daily. Use in each nostril as directed 05/22/24  Yes Leath-Warren, Etta PARAS, NP  fluconazole  (DIFLUCAN ) 150 MG tablet Take 1 tablet by mouth every 3 days as needed. 05/22/24  Yes Leath-Warren, Etta PARAS, NP  albuterol  (VENTOLIN  HFA) 108 (90 Base) MCG/ACT inhaler Inhale 2 puffs into the lungs every 6 (six) hours as needed for wheezing or shortness of breath. 10/21/21   Leath-Warren, Etta PARAS, NP  Cyanocobalamin  (VITAMIN B-12 SL) Place 3,000 mcg under the tongue in the morning.    [provider]  EPINEPHrine 0.3 mg/0.3 mL IJ SOAJ injection Inject 0.3 mg into the muscle as needed for anaphylaxis. 11/28/19   [provider]  fluticasone (FLONASE) 50 MCG/ACT nasal spray Place 1 spray into both nostrils daily as needed for allergies. 02/23/18   [provider]  levocetirizine (XYZAL) 5 MG tablet Take 5 mg by mouth at bedtime. 02/23/18   [provider]   montelukast (SINGULAIR) 10 MG tablet Take 10 mg by mouth in the morning. 10/28/14   [provider]  progesterone  (PROMETRIUM ) 100 MG capsule Take 100 mg by mouth at bedtime. 05/24/23   [provider]  UNABLE TO FIND Allergy shots    [provider]  UNABLE TO FIND Estrogen/testosterone  pellet    [provider]  UNABLE TO FIND Med Name:Venom injections    [provider]  valACYclovir  (VALTREX ) 500 MG tablet Take one tablet (500 mg) by mouth daily for prevention.  Take one tablet by mouth twice a day for 3 days for an outbreak. 06/23/23   Cathlyn JAYSON Nikki Bobie FORBES, MD    Family History Family History  Problem Relation Age of Onset   Hypertension Mother    Heart attack Mother    Fibroids Mother        h/x hysterectomy   Hypothyroidism Mother    Diabetes Mother    Acromegaly Mother    Alzheimer's disease Mother    Hypertension Father    Kidney Stones Father    Diabetes Father    Heart block Father        stint put in   Leukemia Brother    Diabetes Maternal Grandmother    Heart failure Maternal Grandmother    Depression Maternal Grandfather    Parkinson's disease Maternal Grandfather    Cancer Maternal Grandfather        brain   Breast cancer Paternal Aunt    Breast cancer Paternal Aunt    Colon cancer Neg Hx     Social History Social History[1]   Allergies   Bee venom   Review of Systems Review of Systems Per HPI  Physical Exam Triage Vital Signs ED Triage Vitals  Encounter Vitals Group     BP 05/22/24 0902 138/82     Girls Systolic BP Percentile --      Girls Diastolic BP Percentile --      Boys Systolic BP Percentile --      Boys Diastolic BP Percentile --      Pulse Rate 05/22/24 0902 65     Resp 05/22/24 0902 19     Temp 05/22/24 0902 (!) 97.5 F (36.4 C)     Temp Source 05/22/24 0902 Oral     SpO2 05/22/24 0902 98 %     Weight --      Height --      Head Circumference --      Peak Flow --      Pain  Score 05/22/24 0900  4     Pain Loc --      Pain Education --      Exclude from Growth Chart --    No data found.  Updated Vital Signs BP 138/82 (BP Location: Right Arm)   Pulse 65   Temp (!) 97.5 F (36.4 C) (Oral)   Resp 19   LMP 02/06/2018   SpO2 98%   Visual Acuity Right Eye Distance:   Left Eye Distance:   Bilateral Distance:    Right Eye Near:   Left Eye Near:    Bilateral Near:     Physical Exam Vitals and nursing note reviewed.  Constitutional:      General: She is not in acute distress.    Appearance: Normal appearance.  HENT:     Head: Normocephalic.     Right Ear: Tympanic membrane, ear canal and external ear normal.     Left Ear: Tympanic membrane, ear canal and external ear normal.     Nose: Congestion present.     Right Turbinates: Enlarged and swollen.     Left Turbinates: Enlarged and swollen.     Right Sinus: Maxillary sinus tenderness and frontal sinus tenderness present.     Left Sinus: Maxillary sinus tenderness and frontal sinus tenderness present.     Mouth/Throat:     Lips: Pink.     Mouth: Mucous membranes are moist.     Pharynx: Postnasal drip present. No pharyngeal swelling, oropharyngeal exudate, posterior oropharyngeal erythema or uvula swelling.     Comments: Cobblestoning present to posterior oropharynx  Eyes:     Extraocular Movements: Extraocular movements intact.     Conjunctiva/sclera: Conjunctivae normal.     Pupils: Pupils are equal, round, and reactive to light.  Cardiovascular:     Rate and Rhythm: Normal rate and regular rhythm.     Pulses: Normal pulses.     Heart sounds: Normal heart sounds.  Pulmonary:     Effort: Pulmonary effort is normal. No respiratory distress.     Breath sounds: Normal breath sounds. No stridor. No wheezing, rhonchi or rales.  Abdominal:     General: Bowel sounds are normal.     Palpations: Abdomen is soft.  Musculoskeletal:     Cervical back: Normal range of motion.  Skin:    General: Skin is  warm and dry.  Neurological:     General: No focal deficit present.     Mental Status: She is alert and oriented to person, place, and time.  Psychiatric:        Mood and Affect: Mood normal.        Behavior: Behavior normal.      UC Treatments / Results  Labs (all labs ordered are listed, but only abnormal results are displayed) Labs Reviewed  POC SOFIA SARS ANTIGEN FIA    EKG   Radiology No results found.  Procedures Procedures (including critical care time)  Medications Ordered in UC Medications - No data to display  Initial Impression / Assessment and Plan / UC Course  I have reviewed the triage vital signs and the nursing notes.  Pertinent labs & imaging results that were available during my care of the patient were reviewed by me and considered in my medical decision making (see chart for details).  COVID test was negative.  On exam, patient with moderate maxillary and frontal sinus tenderness.  Given the patient's underlying history of chronic pansinusitis.  Will go ahead and treat for bacterial etiology.  Treat with  Augmentin 875/125 mg tablets twice daily for the next 7 days and azelastine nasal spray for nasal congestion and postnasal drainage.  Yeast prophylaxis provided with fluconazole  150 mg.  Supportive care recommendations were provided and discussed with the patient to include fluids, rest, over-the-counter analgesics, normal saline nasal spray, and use of a humidifier for cough.  Discussed indications with the patient regarding follow-up.  Patient was in agreement with this plan of care and verbalizes understanding.  All questions were answered.  Patient stable for discharge.  Final Clinical Impressions(s) / UC Diagnoses   Final diagnoses:  Symptoms of upper respiratory infection (URI)  Acute pansinusitis, recurrence not specified  History of chronic sinusitis     Discharge Instructions      Take medication as directed. Increase fluids and get  plenty of rest. You may take over-the-counter Tylenol as needed for pain, fever, or general discomfort. Recommend normal saline nasal spray to help with nasal congestion throughout the day. For your cough, it may be helpful to use a humidifier at bedtime during sleep. If symptoms fail to improve with this treatment, you may follow-up in this clinic, with ENT, or with your primary care physician for further evaluation. Follow-up as needed.     ED Prescriptions     Medication Sig Dispense Auth. Provider   amoxicillin-clavulanate (AUGMENTIN) 875-125 MG tablet Take 1 tablet by mouth every 12 (twelve) hours. 14 tablet Leath-Warren, Etta PARAS, NP   azelastine (ASTELIN) 0.1 % nasal spray Place 2 sprays into both nostrils 2 (two) times daily. Use in each nostril as directed 30 mL Leath-Warren, Etta PARAS, NP   fluconazole  (DIFLUCAN ) 150 MG tablet Take 1 tablet by mouth every 3 days as needed. 2 tablet Leath-Warren, Etta PARAS, NP      PDMP not reviewed this encounter.     [1]  Social History Tobacco Use   Smoking status: Every Day    Current packs/day: 0.50    Types: Cigarettes   Smokeless tobacco: Never  Vaping Use   Vaping status: Never Used  Substance Use Topics   Alcohol use: Yes    Alcohol/week: 12.0 standard drinks of alcohol    Types: 12 Standard drinks or equivalent per week    Comment: 2 bottles of wine a week.   Drug use: No     Gilmer Etta PARAS, NP 05/22/24 0932  "

## 2024-06-27 ENCOUNTER — Encounter: Payer: Self-pay | Admitting: Obstetrics and Gynecology

## 2024-06-27 ENCOUNTER — Ambulatory Visit: Payer: Medicare HMO | Admitting: Obstetrics and Gynecology

## 2024-06-27 VITALS — BP 126/80 | HR 61 | Ht 66.0 in | Wt 148.0 lb

## 2024-06-27 DIAGNOSIS — Z9189 Other specified personal risk factors, not elsewhere classified: Secondary | ICD-10-CM

## 2024-06-27 DIAGNOSIS — Z01419 Encounter for gynecological examination (general) (routine) without abnormal findings: Secondary | ICD-10-CM

## 2024-06-27 DIAGNOSIS — B009 Herpesviral infection, unspecified: Secondary | ICD-10-CM | POA: Diagnosis not present

## 2024-06-27 DIAGNOSIS — Z5181 Encounter for therapeutic drug level monitoring: Secondary | ICD-10-CM

## 2024-06-27 DIAGNOSIS — Z9289 Personal history of other medical treatment: Secondary | ICD-10-CM

## 2024-06-27 DIAGNOSIS — M8589 Other specified disorders of bone density and structure, multiple sites: Secondary | ICD-10-CM

## 2024-06-27 MED ORDER — VALACYCLOVIR HCL 500 MG PO TABS: ORAL_TABLET | ORAL | 3 refills | Status: AC

## 2024-06-27 NOTE — Patient Instructions (Signed)
 Calcium in Foods Calcium is a mineral in the body. Of all the minerals in your body, you have the most calcium. Most of the body's calcium supply is stored in bones and teeth. Calcium helps many parts of the body work, including: Blood and blood vessels. Nerves. Hormones. Muscles. Bones and teeth. When your calcium stores are low, you may be at risk for low bone mass, bone loss, and broken bones. When you get enough calcium, it helps to support strong bones and teeth throughout your life. Calcium is especially important for: Children during growth spurts. Females during adolescence. Females who are pregnant or breastfeeding. Females after their menstrual cycle stops (postmenopausal). Females whose menstrual cycle has stopped because of an eating disorder or regular intense exercise. People who can't eat or digest dairy products. People who eat a vegan diet. Recommended daily amounts of calcium: Females (ages 87 to 55): 1,000 mg per day. Females (ages 35 and older): 1,200 mg per day. Males (ages 53 to 45): 1,000 mg per day. Males (ages 43 and older): 1,200 mg per day. Females (ages 88 to 28): 1,300 mg per day. Males (ages 41 to 56): 1,300 mg per day. General information Eat foods that are high in calcium. Try to get most of your calcium from food. Some people may benefit from taking calcium supplements. Check with your health care provider or an expert in healthy eating called a dietitian before starting any calcium supplements. Calcium supplements may interact with certain medicines. Too much calcium may cause other health problems, such as trouble pooping and kidney stones. For the body to absorb calcium, it needs vitamin D. Sources of vitamin D include: Skin exposure to direct sunlight. Foods, such as egg yolks, liver, mushrooms, saltwater fish, and fortified milk. Vitamin D supplements. Check with your provider or dietitian before starting any vitamin D supplements. The amount of  calcium that is absorbed in the body varies with type of food. Talk to a dietitian about what foods are best for you, especially if you are eat a vegan diet or don't eat dairy. What foods are high in calcium?  Foods that are high in calcium contain more than 100 milligrams per serving. Fruits Fortified orange juice or other fruit juice, 300 mg per 8 oz (237 mL) serving. Vegetables Collard greens, 260 mg per 1 cup (130 g) serving, cooked. Kale, 180 mg per 1 cup (118 g) serving, cooked. Bok choy, 180 mg per 1 cup (170 g) serving, cooked Grains Fortified frozen waffles, 200 mg in 2 waffles. Oatmeal, 180 mg in 1 cup (234 g) serving, cooked. Fortified white bread, 175 mg per slice. Meats and other proteins Sardines, canned with bones, 350 mg per 3.75 oz (92 g) serving. Salmon, canned with bones, 168 mg per 3 oz (85 g) serving. Canned shrimp, 125 mg per 3 oz (85 g) serving. Baked beans, 120 mg per 1 cup (266 g) serving. Tofu, firm, made with calcium sulfate, 861 mg per  cup (126 g) serving. Dairy Yogurt, plain, low-fat, 448 mg per 1 cup (245 g) serving Nonfat milk, 300 mg per 1 cup (245 g) serving. American cheese, 145 mg per 1 oz (21 g) serving or 1 slice. Cheddar cheese, 200 mg per 1 oz (28 g) serving or 1 slice. Cottage cheese 2%, 125 mg per  cup (113 g) serving. Fortified soy, rice, or almond milk, 300 mg per 1 cup (237 mL) serving. Mozzarella, part skim, 210 mg per 1 oz (21 g) serving. The items listed  above may not be a complete list of foods high in calcium. Actual amounts of calcium may be different depending on processing. Contact a dietitian for more information. What foods are lower in calcium? Foods that are lower in calcium contain 50 mg or less per serving. Fruits Apple, 1 medium, about 6 mg. Banana, 1 medium, about 12 mg. Vegetables Lettuce, 19 mg per 1 cup (35 g) serving. Tomato, 1 small, about 11 mg. Grains Rice, white, 8 mg per  cup (79 g) serving. Boiled  potatoes, 14 mg per 1 cup (160 g) serving. White bread, 6 mg per slice. Meats and other proteins Egg, 24 mg per 1 egg (50 g). Red meat, 7 mg per 4 oz (80 g) serving. Chicken, 17 mg per 4 oz (113 g) serving. Fish, cod, or trout, 20 mg per 4 oz (140 g) serving. Dairy Cream cheese, regular, 14 mg per 1 Tbsp (15 g) serving. Brie cheese, 50 mg per 1 oz (32 g) serving. The items listed above may not be a complete list of foods lower in calcium. Actual amounts of calcium may be different depending on processing. Contact a dietitian for more information. This information is not intended to replace advice given to you by your health care provider. Make sure you discuss any questions you have with your health care provider. Document Revised: 02/05/2023 Document Reviewed: 02/05/2023 Elsevier Patient Education  2024 [employer]  EXERCISE AND DIET:  We recommended that you start or continue a regular exercise program for good health. Regular exercise means any activity that makes your heart beat faster and makes you sweat.  We recommend exercising at least 30 minutes per day at least 3 days a week, preferably 4 or 5.  We also recommend a diet low in fat and sugar.  Inactivity, poor dietary choices and obesity can cause diabetes, heart attack, stroke, and kidney damage, among others.    ALCOHOL AND SMOKING:  Women should limit their alcohol intake to no more than 7 drinks/beers/glasses of wine (combined, not each!) per week. Moderation of alcohol intake to this level decreases your risk of breast cancer and liver damage. And of course, no recreational drugs are part of a healthy lifestyle.  And absolutely no smoking or even second hand smoke. Most people know smoking can cause heart and lung diseases, but did you know it also contributes to weakening of your bones? Aging of your skin?  Yellowing of your teeth and nails?  CALCIUM AND VITAMIN D:  Adequate intake of calcium and Vitamin D are recommended.  The  recommendations for exact amounts of these supplements seem to change often, but generally speaking 600 mg of calcium (either carbonate or citrate) and 800 units of Vitamin D per day seems prudent. Certain women may benefit from higher intake of Vitamin D.  If you are among these women, your doctor will have told you during your visit.    PAP SMEARS:  Pap smears, to check for cervical cancer or precancers,  have traditionally been done yearly, although recent scientific advances have shown that most women can have pap smears less often.  However, every woman still should have a physical exam from her gynecologist every year. It will include a breast check, inspection of the vulva and vagina to check for abnormal growths or skin changes, a visual exam of the cervix, and then an exam to evaluate the size and shape of the uterus and ovaries.  And after 67 years of age, a rectal exam  is indicated to check for rectal cancers. We will also provide age appropriate advice regarding health maintenance, like when you should have certain vaccines, screening for sexually transmitted diseases, bone density testing, colonoscopy, mammograms, etc.   MAMMOGRAMS:  All women over 11 years old should have a yearly mammogram. Many facilities now offer a "3D" mammogram, which may cost around $50 extra out of pocket. If possible,  we recommend you accept the option to have the 3D mammogram performed.  It both reduces the number of women who will be called back for extra views which then turn out to be normal, and it is better than the routine mammogram at detecting truly abnormal areas.    COLONOSCOPY:  Colonoscopy to screen for colon cancer is recommended for all women at age 8.  We know, you hate the idea of the prep.  We agree, BUT, having colon cancer and not knowing it is worse!!  Colon cancer so often starts as a polyp that can be seen and removed at colonscopy, which can quite literally save your life!  And if your first  colonoscopy is normal and you have no family history of colon cancer, most women don't have to have it again for 10 years.  Once every ten years, you can do something that may end up saving your life, right?  We will be happy to help you get it scheduled when you are ready.  Be sure to check your insurance coverage so you understand how much it will cost.  It may be covered as a preventative service at no cost, but you should check your particular policy.

## 2024-06-27 NOTE — Progress Notes (Signed)
 "  69 y.o. G0P0000 Widowed Caucasian female here for a breast and pelvic exam.    The patient is also followed for HRT, osteopenia, and HSV. HRT through [facility] [place]:  doing implants of estrogen and testosterone  and progesterone  orally.  Has increased dark facial hair and has declined spironolactone. Osteopenia of bilateral hips and spine.  Hx prior LGSIL.  Occasional HSV oral outbreak.  No genital outbreak for many years.   Has arthritis.    Enjoys traveling.   PCP: Marvine Rush, MD   Patient's last menstrual period was 02/06/2018.           Sexually active: Yes.    The current method of family planning is tubal ligation and post menopausal status.    Menopausal hormone therapy:  n/a Exercising: Yes.    Walking 4-5 miles, golfing, gardening  Smoker:  yes  OB History     Gravida  0   Para  0   Term  0   Preterm  0   AB  0   Living  0      SAB  0   IAB  0   Ectopic  0   Multiple  0   Live Births              HEALTH MAINTENANCE: Last 2 paps: 06/23/23 neg, HR HPV neg, 12/25/20 neg, HR HPV neg, 12-15-17 Neg:Neg HR HPV   History of abnormal Pap or positive HPV:  yes, 06-26-15 ASCUS:Neg HR HPV,  11-01-14 LGSIL, colpo HPV effect & benign endocervical glandular tissue.  Mammogram:  05/09/24 Breast Density Cat C, BIRADS Cat 1 neg  Colonoscopy:  10/30/21 polyps - due in 2033  Bone Density:  05/09/24  Result  osteopenia spine and hips.      Immunization History  Administered Date(s) Administered   Moderna Sars-Covid-2 Vaccination 02/26/2020   Tdap 05/24/2008, 12/18/2018      reports that she has been smoking cigarettes. She has never used smokeless tobacco. She reports current alcohol use of about 12.0 standard drinks of alcohol per week. She reports that she does not use drugs.  Past Medical History:  Diagnosis Date   Abdominal pain, epigastric    Abnormal Pap smear of cervix    11-01-14 LGSIL   Arthritis    in back   Asthma    as a child   Constipation     COVID-19 virus infection 08/2020   History of Salmonella infection    HSV infection    Irritable bowel syndrome    Melanoma of skin, site unspecified    Osteopenia 2025   bilateral hips and spine   PONV (postoperative nausea and vomiting)    Rotator cuff syndrome of left shoulder    Seasonal allergies    Shoulder pain     Past Surgical History:  Procedure Laterality Date   COLONOSCOPY WITH PROPOFOL  N/A 10/30/2021   Procedure: COLONOSCOPY WITH PROPOFOL ;  Surgeon: Cindie Carlin POUR, DO;  Location: [facility];  Service: Endoscopy;  Laterality: N/A;  10:30am   COLPOSCOPY  2016   DILATION AND CURETTAGE OF UTERUS  1997   bleeding   MOLE REMOVAL     cancer   POLYPECTOMY  10/30/2021   Procedure: POLYPECTOMY;  Surgeon: Cindie Carlin POUR, DO;  Location: [facility];  Service: Endoscopy;;   SKIN GRAFT     Thermal Ablation     of the uterus   TUBAL LIGATION      Current Outpatient Medications  Medication Sig Dispense Refill   albuterol  (VENTOLIN  HFA) 108 (90 Base) MCG/ACT inhaler Inhale 2 puffs into the lungs every 6 (six) hours as needed for wheezing or shortness of breath. 8 g 0   azelastine  (ASTELIN ) 0.1 % nasal spray Place 2 sprays into both nostrils 2 (two) times daily. Use in each nostril as directed 30 mL 0   Cyanocobalamin  (VITAMIN B-12 SL) Place 3,000 mcg under the tongue in the morning.     EPINEPHrine 0.3 mg/0.3 mL IJ SOAJ injection Inject 0.3 mg into the muscle as needed for anaphylaxis.     fluticasone (FLONASE) 50 MCG/ACT nasal spray Place 1 spray into both nostrils daily as needed for allergies.     levocetirizine (XYZAL) 5 MG tablet Take 5 mg by mouth at bedtime.     montelukast (SINGULAIR) 10 MG tablet Take 10 mg by mouth in the morning.     progesterone  (PROMETRIUM ) 100 MG capsule Take 100 mg by mouth at bedtime.     UNABLE TO FIND Estrogen/testosterone  pellet     valACYclovir  (VALTREX ) 500 MG tablet Take one tablet (500 mg) by mouth daily for prevention.  Take one  tablet by mouth twice a day for 3 days for an outbreak. 110 tablet 3   UNABLE TO FIND Allergy shots (Patient not taking: Reported on 06/27/2024)     UNABLE TO FIND Med Name:Venom injections (Patient not taking: Reported on 06/27/2024)     No current facility-administered medications for this visit.    ALLERGIES: Bee venom  Family History  Problem Relation Age of Onset   Hypertension Mother    Heart attack Mother    Fibroids Mother        h/x hysterectomy   Hypothyroidism Mother    Diabetes Mother    Acromegaly Mother    Alzheimer's disease Mother    Hypertension Father    Kidney Stones Father    Diabetes Father    Heart block Father        stint put in   Leukemia Brother    Diabetes Maternal Grandmother    Heart failure Maternal Grandmother    Depression Maternal Grandfather    Parkinson's disease Maternal Grandfather    Cancer Maternal Grandfather        brain   Breast cancer Paternal Aunt    Breast cancer Paternal Aunt    Colon cancer Neg Hx     Review of Systems  All other systems reviewed and are negative.   PHYSICAL EXAM:  BP 126/80 (BP Location: Left Arm, Patient Position: Sitting)   Pulse 61   Ht 5' 6 (1.676 m)   Wt 148 lb (67.1 kg)   LMP 02/06/2018   SpO2 99%   BMI 23.89 kg/m     General appearance: alert, cooperative and appears stated age Head: normocephalic, without obvious abnormality, atraumatic Neck: no adenopathy, supple, symmetrical, trachea midline and thyroid  normal to inspection and palpation Lungs: clear to auscultation bilaterally Breasts: normal appearance, no masses or tenderness, No nipple retraction or dimpling, No nipple discharge or bleeding, No axillary adenopathy Heart: regular rate and rhythm Abdomen: soft, non-tender; no masses, no organomegaly Extremities: extremities normal, atraumatic, no cyanosis or edema Skin: skin color, texture, turgor normal. No rashes or lesions Lymph nodes: cervical, supraclavicular, and axillary nodes  normal. Neurologic: grossly normal  Pelvic: External genitalia:  no lesions              No abnormal inguinal nodes palpated.  Urethra:  normal appearing urethra with no masses, tenderness or lesions              Bartholins and Skenes: normal                 Vagina: normal appearing vagina with normal color and discharge, no lesions              Cervix: no lesions              Pap taken: no Bimanual Exam:  Uterus:  normal size, contour, position, consistency, mobility, non-tender              Adnexa: no mass, fullness, tenderness              Rectal exam: yes.  Confirms.              Anus:  normal sphincter tone, no lesions  Chaperone was present for exam:  Kari HERO, CMA  ASSESSMENT: Encounter for breast and pelvic exam.  Personal hx of other risk factors. Personal history of other medical treatment.  Hx endometrial ablation.  HRT and testosterone  tx at [facility].  Hx HSV.  Oral and genital.  Uses Valtrex  prn.  Encounter for medication monitoring. Hx LGSIL.  Follow up paps normal.   Osteopenia hips and spine.  Hx melanoma.   PLAN: Mammogram screening discussed. Self breast awareness reviewed. Pap and HRV collected:  no.  Due in 2030.  Guidelines for Calcium, Vitamin D , regular exercise program including cardiovascular and weight bearing exercise. Medication refills:  Valtrex  500 mg tablet.  Take 4 tablets (2000 mg) by mouth twice daily for 1 day for an oral outbreak. Take one tablet by mouth twice a day for 3 days for a genital outbreak.  Disp:  30, RF 3.  Next bone density in 2 years.   Labs with PCP.  Follow up:  yearly and prn.     Additional counseling given.  yes. 25 min  total time was spent for this patient encounter, including preparation, face-to-face counseling with the patient, coordination of care, and documentation of the encounter in addition to doing the breast and pelvic exam.        "

## 2024-08-13 ENCOUNTER — Ambulatory Visit (INDEPENDENT_AMBULATORY_CARE_PROVIDER_SITE_OTHER): Admitting: Otolaryngology

## 2025-07-03 ENCOUNTER — Encounter: Admitting: Obstetrics and Gynecology
